# Patient Record
Sex: Male | Born: 1980 | Race: Black or African American | Hispanic: No | Marital: Single | Smoking: Never smoker
Health system: Southern US, Community
[De-identification: ages and names within clinical notes are randomized; demographics above are authoritative.]

## PROBLEM LIST (undated history)

## (undated) DIAGNOSIS — J9601 Acute respiratory failure with hypoxia: Secondary | ICD-10-CM

## (undated) DIAGNOSIS — I5031 Acute diastolic (congestive) heart failure: Secondary | ICD-10-CM

## (undated) DIAGNOSIS — I10 Essential (primary) hypertension: Secondary | ICD-10-CM

## (undated) DIAGNOSIS — I509 Heart failure, unspecified: Secondary | ICD-10-CM

## (undated) DIAGNOSIS — J111 Influenza due to unidentified influenza virus with other respiratory manifestations: Secondary | ICD-10-CM

## (undated) DIAGNOSIS — G4733 Obstructive sleep apnea (adult) (pediatric): Secondary | ICD-10-CM

## (undated) DIAGNOSIS — D751 Secondary polycythemia: Secondary | ICD-10-CM

## (undated) HISTORY — PX: OTHER SURGICAL HISTORY: SHX169

---
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
## 2018-09-19 ENCOUNTER — Encounter (HOSPITAL_COMMUNITY): Payer: Self-pay | Admitting: Internal Medicine

## 2018-09-19 ENCOUNTER — Inpatient Hospital Stay (HOSPITAL_COMMUNITY)
Admission: AD | Admit: 2018-09-19 | Discharge: 2018-09-27 | DRG: 291 | Disposition: A | Discharge status: Home / Self Care | Payer: PRIVATE HEALTH INSURANCE | Attending: Student | Admitting: Student

## 2018-09-19 ENCOUNTER — Other Ambulatory Visit: Payer: Self-pay

## 2018-09-19 DIAGNOSIS — Z7951 Long term (current) use of inhaled steroids: Secondary | ICD-10-CM | POA: Diagnosis not present

## 2018-09-19 DIAGNOSIS — Z8249 Family history of ischemic heart disease and other diseases of the circulatory system: Secondary | ICD-10-CM | POA: Diagnosis present

## 2018-09-19 DIAGNOSIS — Z5320 Procedure and treatment not carried out because of patient's decision for unspecified reasons: Secondary | ICD-10-CM | POA: Diagnosis not present

## 2018-09-19 DIAGNOSIS — E66813 Obesity, class 3: Secondary | ICD-10-CM

## 2018-09-19 DIAGNOSIS — Z7984 Long term (current) use of oral hypoglycemic drugs: Secondary | ICD-10-CM | POA: Diagnosis not present

## 2018-09-19 DIAGNOSIS — I2723 Pulmonary hypertension due to lung diseases and hypoxia: Secondary | ICD-10-CM | POA: Diagnosis present

## 2018-09-19 DIAGNOSIS — I4519 Other right bundle-branch block: Secondary | ICD-10-CM | POA: Diagnosis present

## 2018-09-19 DIAGNOSIS — I509 Heart failure, unspecified: Secondary | ICD-10-CM | POA: Insufficient documentation

## 2018-09-19 DIAGNOSIS — R7303 Prediabetes: Secondary | ICD-10-CM | POA: Diagnosis present

## 2018-09-19 DIAGNOSIS — G4733 Obstructive sleep apnea (adult) (pediatric): Secondary | ICD-10-CM | POA: Diagnosis present

## 2018-09-19 DIAGNOSIS — Z1159 Encounter for screening for other viral diseases: Secondary | ICD-10-CM | POA: Diagnosis not present

## 2018-09-19 DIAGNOSIS — J101 Influenza due to other identified influenza virus with other respiratory manifestations: Secondary | ICD-10-CM | POA: Diagnosis present

## 2018-09-19 DIAGNOSIS — J9621 Acute and chronic respiratory failure with hypoxia: Secondary | ICD-10-CM | POA: Diagnosis present

## 2018-09-19 DIAGNOSIS — E785 Hyperlipidemia, unspecified: Secondary | ICD-10-CM | POA: Diagnosis present

## 2018-09-19 DIAGNOSIS — E7800 Pure hypercholesterolemia, unspecified: Secondary | ICD-10-CM | POA: Diagnosis not present

## 2018-09-19 DIAGNOSIS — I2722 Pulmonary hypertension due to left heart disease: Secondary | ICD-10-CM | POA: Diagnosis present

## 2018-09-19 DIAGNOSIS — G4730 Sleep apnea, unspecified: Secondary | ICD-10-CM | POA: Diagnosis not present

## 2018-09-19 DIAGNOSIS — I5032 Chronic diastolic (congestive) heart failure: Secondary | ICD-10-CM | POA: Insufficient documentation

## 2018-09-19 DIAGNOSIS — Z6841 Body Mass Index (BMI) 40.0 and over, adult: Secondary | ICD-10-CM

## 2018-09-19 DIAGNOSIS — J9622 Acute and chronic respiratory failure with hypercapnia: Secondary | ICD-10-CM | POA: Diagnosis present

## 2018-09-19 DIAGNOSIS — J9601 Acute respiratory failure with hypoxia: Secondary | ICD-10-CM

## 2018-09-19 DIAGNOSIS — J1000 Influenza due to other identified influenza virus with unspecified type of pneumonia: Secondary | ICD-10-CM | POA: Diagnosis present

## 2018-09-19 DIAGNOSIS — I272 Pulmonary hypertension, unspecified: Secondary | ICD-10-CM | POA: Diagnosis not present

## 2018-09-19 DIAGNOSIS — J1100 Influenza due to unidentified influenza virus with unspecified type of pneumonia: Secondary | ICD-10-CM | POA: Diagnosis not present

## 2018-09-19 DIAGNOSIS — Z20828 Contact with and (suspected) exposure to other viral communicable diseases: Secondary | ICD-10-CM | POA: Diagnosis not present

## 2018-09-19 DIAGNOSIS — J111 Influenza due to unidentified influenza virus with other respiratory manifestations: Secondary | ICD-10-CM

## 2018-09-19 DIAGNOSIS — I10 Essential (primary) hypertension: Secondary | ICD-10-CM

## 2018-09-19 DIAGNOSIS — I5031 Acute diastolic (congestive) heart failure: Secondary | ICD-10-CM | POA: Diagnosis present

## 2018-09-19 DIAGNOSIS — Z79899 Other long term (current) drug therapy: Secondary | ICD-10-CM | POA: Diagnosis not present

## 2018-09-19 DIAGNOSIS — I110 Hypertensive heart disease with heart failure: Principal | ICD-10-CM | POA: Diagnosis present

## 2018-09-19 DIAGNOSIS — E662 Morbid (severe) obesity with alveolar hypoventilation: Secondary | ICD-10-CM | POA: Diagnosis present

## 2018-09-19 DIAGNOSIS — J398 Other specified diseases of upper respiratory tract: Secondary | ICD-10-CM | POA: Diagnosis present

## 2018-09-19 DIAGNOSIS — D751 Secondary polycythemia: Secondary | ICD-10-CM | POA: Diagnosis present

## 2018-09-19 DIAGNOSIS — D582 Other hemoglobinopathies: Secondary | ICD-10-CM | POA: Diagnosis not present

## 2018-09-19 DIAGNOSIS — I5041 Acute combined systolic (congestive) and diastolic (congestive) heart failure: Secondary | ICD-10-CM | POA: Diagnosis not present

## 2018-09-19 DIAGNOSIS — Z8042 Family history of malignant neoplasm of prostate: Secondary | ICD-10-CM | POA: Diagnosis not present

## 2018-09-19 HISTORY — DX: Acute respiratory failure with hypoxia: J96.01

## 2018-09-19 HISTORY — DX: Influenza due to unidentified influenza virus with other respiratory manifestations: J11.1

## 2018-09-19 HISTORY — DX: Essential (primary) hypertension: I10

## 2018-09-19 HISTORY — DX: Heart failure, unspecified: I50.9

## 2018-09-19 HISTORY — DX: Morbid (severe) obesity due to excess calories: E66.01

## 2018-09-19 HISTORY — DX: Obesity, class 3: E66.813

## 2018-09-19 LAB — COMPREHENSIVE METABOLIC PANEL
ALT: 34 U/L (ref 0–44)
AST: 26 U/L (ref 15–41)
Albumin: 3.4 g/dL — ABNORMAL LOW (ref 3.5–5.0)
Alkaline Phosphatase: 61 U/L (ref 38–126)
Anion gap: 13 (ref 5–15)
BUN: 12 mg/dL (ref 6–20)
CO2: 37 mmol/L — ABNORMAL HIGH (ref 22–32)
Calcium: 9.4 mg/dL (ref 8.9–10.3)
Chloride: 97 mmol/L — ABNORMAL LOW (ref 98–111)
Creatinine, Ser: 1.27 mg/dL — ABNORMAL HIGH (ref 0.61–1.24)
GFR calc Af Amer: >60 mL/min (ref >60)
GFR calc non Af Amer: >60 mL/min (ref >60)
Glucose, Bld: 102 mg/dL — ABNORMAL HIGH (ref 70–99)
Potassium: 4.3 mmol/L (ref 3.5–5.1)
Sodium: 147 mmol/L — ABNORMAL HIGH (ref 135–145)
Total Bilirubin: 0.9 mg/dL (ref 0.3–1.2)
Total Protein: 6 g/dL — ABNORMAL LOW (ref 6.5–8.1)

## 2018-09-19 LAB — CBC WITH DIFFERENTIAL/PLATELET
Abs Immature Granulocytes: 0.06 10*3/uL (ref 0.00–0.07)
Basophils Absolute: 0 10*3/uL (ref 0.0–0.1)
Basophils Relative: 1 %
Eosinophils Absolute: 0 10*3/uL (ref 0.0–0.5)
Eosinophils Relative: 0 %
HCT: 60.1 % — ABNORMAL HIGH (ref 39.0–52.0)
Hemoglobin: 17.4 g/dL — ABNORMAL HIGH (ref 13.0–17.0)
Immature Granulocytes: 1 %
Lymphocytes Relative: 13 %
Lymphs Abs: 1.1 10*3/uL (ref 0.7–4.0)
MCH: 24.3 pg — ABNORMAL LOW (ref 26.0–34.0)
MCHC: 29 g/dL — ABNORMAL LOW (ref 30.0–36.0)
MCV: 83.9 fL (ref 80.0–100.0)
Monocytes Absolute: 0.8 10*3/uL (ref 0.1–1.0)
Monocytes Relative: 9 %
Neutro Abs: 6.8 10*3/uL (ref 1.7–7.7)
Neutrophils Relative %: 76 %
Platelets: 203 10*3/uL (ref 150–400)
RBC: 7.16 MIL/uL — ABNORMAL HIGH (ref 4.22–5.81)
RDW: 20 % — ABNORMAL HIGH (ref 11.5–15.5)
WBC: 8.8 10*3/uL (ref 4.0–10.5)
nRBC: 0.3 % — ABNORMAL HIGH (ref 0.0–0.2)

## 2018-09-19 LAB — FERRITIN: Ferritin: 33 ng/mL (ref 24–336)

## 2018-09-19 MED ORDER — FUROSEMIDE 10 MG/ML IJ SOLN
80.0000 mg | Freq: Two times a day (BID) | INTRAMUSCULAR | Status: DC
  Administered 2018-09-19 – 2018-09-21 (×6): 80 mg via INTRAVENOUS
  Filled 2018-09-19 (×7): qty 8

## 2018-09-19 MED ORDER — HEPARIN SODIUM (PORCINE) 5000 UNIT/ML IJ SOLN
5000.0000 [IU] | Freq: Three times a day (TID) | INTRAMUSCULAR | Status: DC
  Administered 2018-09-19 – 2018-09-20 (×4): 5000 [IU] via SUBCUTANEOUS
  Filled 2018-09-19 (×4): qty 1

## 2018-09-19 MED ORDER — ONDANSETRON HCL 4 MG PO TABS: 4.0000 mg | ORAL_TABLET | Freq: Four times a day (QID) | ORAL | Status: DC | PRN

## 2018-09-19 MED ORDER — ONDANSETRON HCL 4 MG/2ML IJ SOLN: 4.0000 mg | Freq: Four times a day (QID) | INTRAMUSCULAR | Status: DC | PRN

## 2018-09-19 MED ORDER — SODIUM CHLORIDE 0.9% FLUSH: 10.0000 mL | INTRAVENOUS | Status: DC | PRN

## 2018-09-19 MED ORDER — SODIUM CHLORIDE 0.9% FLUSH
3.0000 mL | Freq: Two times a day (BID) | INTRAVENOUS | Status: DC
  Administered 2018-09-19 (×2): 3 mL via INTRAVENOUS

## 2018-09-19 MED ORDER — ACETAMINOPHEN 325 MG PO TABS
650.0000 mg | ORAL_TABLET | Freq: Four times a day (QID) | ORAL | Status: DC | PRN
  Administered 2018-09-24 – 2018-09-26 (×2): 650 mg via ORAL
  Filled 2018-09-19 (×2): qty 2

## 2018-09-19 MED ORDER — SODIUM CHLORIDE 0.9 % IV SOLN: 250.0000 mL | INTRAVENOUS | Status: DC | PRN

## 2018-09-19 MED ORDER — SODIUM CHLORIDE 0.9% FLUSH
3.0000 mL | Freq: Two times a day (BID) | INTRAVENOUS | Status: DC
  Administered 2018-09-19 – 2018-09-21 (×4): 3 mL via INTRAVENOUS

## 2018-09-19 MED ORDER — OSELTAMIVIR PHOSPHATE 75 MG PO CAPS
75.0000 mg | ORAL_CAPSULE | Freq: Two times a day (BID) | ORAL | Status: AC
  Administered 2018-09-19 – 2018-09-24 (×10): 75 mg via ORAL
  Filled 2018-09-19 (×11): qty 1

## 2018-09-19 MED ORDER — SODIUM CHLORIDE 0.9% FLUSH: 3.0000 mL | INTRAVENOUS | Status: DC | PRN

## 2018-09-19 NOTE — Progress Notes (Signed)
Daily Nursing Note  Family Medicine - Dr. Maylene Roes  Received report from Nadean Corwin, RN at [facility]. There was a bit of confusion concerning the patients admission as I have not received a formal report prior to patient arriving. Introduced self to patient who at the time stated he was "swollen all over". We discussed the fact that he had Influenza A/B and whether or not he had been exposed to Covid patients. 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 Patient given lasix 85m IVP on arrival. A midline catheter was placed in his R Upper arm given the complexity of blood draws for this patient as he is obese as well as edematous. Patient is not aware of a prior h/o of CHF though thinks from what he read on WebMD that this is a likely possibility given his symptoms. Workup remains in process. All patients need met during shift.   Micro: Novel Coronavirus - Pending though [facility]  Updates: Patient able to advocate for himself.   Dispo: Will be discharged to home when medically optimized.

## 2018-09-19 NOTE — H&P (Addendum)
History and Physical    Frank Watts CWU:[id number] DOB: December 15, 1980 DOA: 09/19/2018  PCP: No primary care provider on file.  Patient coming from: Home --[facility] --> [facility]   Chief Complaint: Exertional dyspnea   HPI: Frank Watts is a 38 y.o. male with medical history significant of recently diagnosed HTN who has not seen his primary care physician for many years who presented to [facility] ED with several week history of shortness of breath.  He had seen a city Therapist, sports (patient works for [employer]) who had prescribed steroids, antibiotics and albuterol.  Despite these treatments, patient has not seen any improvement.  He denies any fevers or sick contacts.  He does admit to lower extremity peripheral edema that has been ongoing for several weeks, as well as upper extremity edema as well.  He denies any fevers, chills or body aches.  No chest pain.  He had some nausea without vomiting.  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  His cough has been mix of clear productive sputum and nonproductive cough.  ED Course: Patient required up to 6 L nasal cannula O2 to maintain saturation at 100% in the emergency department.   [facility] ED work up: WBC 10.4 Hgb 17.9 Na 141 K 4.9 Cr 1.3 Lactic 3.1 --> 1.8 AST 33  ALT 42 LDH 871 Trop 0.03 CRP 33.4 Pro BNP 5690 Procalcitonin 0.21 Covid 19- pending  Influenza A and B positive. Given dose of tamiflu  CXR limited exam due to body habitus, cardiomegaly,questionable left suprahilar opacity  EKG NSR, incomplete RBBB   Review of Systems: As per HPI otherwise 10 point review of systems negative.   Past Medical History:  Diagnosis Date  . HTN (hypertension)     Past Surgical History:  Procedure Laterality Date  . OTHER SURGICAL HISTORY     Bicep surgery     reports that he has never smoked. He does not have any smokeless tobacco history on file. No history on file for alcohol and drug.  No  Known Allergies  Family History  Problem Relation Age of Onset  . Hypertension Mother   . Prostate cancer Father     Prior to Admission medications   Not on File    Physical Exam: Vitals:   09/19/18 1013  BP: (!) 143/97  Pulse: 85  Resp: (!) 25  Temp: 98.7 F (37.1 C)  TempSrc: Oral  SpO2: 91%  Weight: (!) 170.1 kg  Height: _0  (1.778 m)    Constitutional: NAD, calm, comfortable Eyes: PERRL, lids and conjunctivae normal ENMT: Mucous membranes are moist. Posterior pharynx clear of any exudate or lesions.Normal dentition.  Neck: normal, supple, no masses, no thyromegaly Respiratory: Diminished breath sounds bilaterally, no respiratory distress, currently on 4 L nasal cannula O2 Cardiovascular: Regular rate and rhythm, + pitting peripheral edema Abdomen: no tenderness, no masses palpated. No hepatosplenomegaly. Bowel sounds positive.  Musculoskeletal: no clubbing / cyanosis. No joint deformity upper and lower extremities. Normal muscle tone.  Skin: no rashes, lesions, ulcers on exposed skin Neurologic: CN 2-12 grossly intact.  Speech clear Psychiatric: Normal judgment and insight. Alert and oriented x 3. Normal mood.   Labs on Admission: I have personally reviewed following labs and imaging studies  CBC: No results for input(s): WBC, NEUTROABS, HGB, HCT, MCV, PLT in the last 168 hours. Basic Metabolic Panel: No results for input(s): NA, K, CL, CO2, GLUCOSE, BUN, CREATININE, CALCIUM, MG, PHOS in the last 168 hours. GFR:  CrCl cannot be calculated (No successful lab value found.). Liver Function Tests: No results for input(s): AST, ALT, ALKPHOS, BILITOT, PROT, ALBUMIN in the last 168 hours. No results for input(s): LIPASE, AMYLASE in the last 168 hours. No results for input(s): AMMONIA in the last 168 hours. Coagulation Profile: No results for input(s): INR, PROTIME in the last 168 hours. Cardiac Enzymes: No results for input(s): CKTOTAL, CKMB, CKMBINDEX, TROPONINI  in the last 168 hours. BNP (last 3 results) No results for input(s): PROBNP in the last 8760 hours. HbA1C: No results for input(s): HGBA1C in the last 72 hours. CBG: No results for input(s): GLUCAP in the last 168 hours. Lipid Profile: No results for input(s): CHOL, HDL, LDLCALC, TRIG, CHOLHDL, LDLDIRECT in the last 72 hours. Thyroid Function Tests: No results for input(s): TSH, T4TOTAL, FREET4, T3FREE, THYROIDAB in the last 72 hours. Anemia Panel: No results for input(s): VITAMINB12, FOLATE, FERRITIN, TIBC, IRON, RETICCTPCT in the last 72 hours. Urine analysis: No results found for: COLORURINE, APPEARANCEUR, LABSPEC, PHURINE, GLUCOSEU, HGBUR, BILIRUBINUR, KETONESUR, PROTEINUR, UROBILINOGEN, NITRITE, LEUKOCYTESUR Sepsis Labs: !!!!!!!!!!!!!!!!!!!!!!!!!!!!!!!!!!!!!!!!!!!! _0 (procalcitonin:4,lacticidven:4) )No results found for this or any previous visit (from the past 240 hour(s)).   Radiological Exams on Admission: No results found.   Assessment/Plan Principal Problem:   Acute hypoxemic respiratory failure (HCC) Active Problems:   Acute CHF (congestive heart failure) (HCC)   Influenza   Essential hypertension   Morbid obesity  Acute hypoxemic respiratory failure -Required up to 6 L nasal cannula O2 in the emergency department, currently weaned down to 4 L.  Continue to wean as able  New onset congestive heart failure -Unknown if systolic or diastolic -Echocardiogram when COVID testing has been ruled out -Continue IV Lasix -Strict I's and O's -Daily weight  Influenza A and B -Tamiflu x5 days  Essential hypertension -Patient was recently started on antihypertensive, patient does not recall name of this medication -May consider coreg or cozaar initiation once we have echocardiogram results and trending Cr   Morbid obesity Body mass index is 53.81 kg/m.     DVT prophylaxis: Subcutaneous heparin Code Status: Full code, confirmed with patient at time of  admission Family Communication: None Disposition Plan: Pending diuresis and COVID testing Consults called: None Admission status: Inpatient  Severity of Illness: The appropriate patient status for this patient is INPATIENT. Inpatient status is judged to be reasonable and necessary in order to provide the required intensity of service to ensure the patient's safety. The patient's presenting symptoms, physical exam findings, and initial radiographic and laboratory data in the context of their chronic comorbidities is felt to place them at high risk for further clinical deterioration. Furthermore, it is not anticipated that the patient will be medically stable for discharge from the hospital within 2 midnights of admission. The following factors support the patient status of inpatient.   " The patient's presenting symptoms include exertional shortness of breath. " The worrisome physical exam findings include bilateral peripheral edema. " The initial radiographic and laboratory data are worrisome because of elevated proBNP. " The chronic co-morbidities include morbid obesity.   * I certify that at the point of admission it is my clinical judgment that the patient will require inpatient hospital care spanning beyond 2 midnights from the point of admission due to high intensity of service, high risk for further deterioration and high frequency of surveillance required.Dessa Phi, DO [facility] 09/19/2018, 10:53 AM    How to contact the [facility] Attending or Consulting provider [place]  or covering provider during after hours [place], for this patient?  1. Check the care team in [facility] and look for a) attending/consulting [facility] provider listed and b) the [facility] team listed 2. Log into [email or web address] and use [place]'s universal password to access. If you do not have the password, please contact the hospital operator. 3. Locate the [facility] provider you are looking for under [facility] and page  to a number that you can be directly reached. 4. If you still have difficulty reaching the provider, please page the [facility] (Director on Call) for the Hospitalists listed on amion for assistance.

## 2018-09-20 ENCOUNTER — Encounter (HOSPITAL_COMMUNITY): Payer: Self-pay

## 2018-09-20 ENCOUNTER — Inpatient Hospital Stay (HOSPITAL_COMMUNITY): Payer: PRIVATE HEALTH INSURANCE

## 2018-09-20 DIAGNOSIS — J111 Influenza due to unidentified influenza virus with other respiratory manifestations: Secondary | ICD-10-CM

## 2018-09-20 DIAGNOSIS — I5041 Acute combined systolic (congestive) and diastolic (congestive) heart failure: Secondary | ICD-10-CM

## 2018-09-20 DIAGNOSIS — I10 Essential (primary) hypertension: Secondary | ICD-10-CM

## 2018-09-20 LAB — COMPREHENSIVE METABOLIC PANEL
ALT: 30 U/L (ref 0–44)
AST: 28 U/L (ref 15–41)
Albumin: 3.1 g/dL — ABNORMAL LOW (ref 3.5–5.0)
Alkaline Phosphatase: 56 U/L (ref 38–126)
Anion gap: 14 (ref 5–15)
BUN: 13 mg/dL (ref 6–20)
CO2: 37 mmol/L — ABNORMAL HIGH (ref 22–32)
Calcium: 9.2 mg/dL (ref 8.9–10.3)
Chloride: 94 mmol/L — ABNORMAL LOW (ref 98–111)
Creatinine, Ser: 1.13 mg/dL (ref 0.61–1.24)
GFR calc Af Amer: >60 mL/min (ref >60)
GFR calc non Af Amer: >60 mL/min (ref >60)
Glucose, Bld: 99 mg/dL (ref 70–99)
Potassium: 4.2 mmol/L (ref 3.5–5.1)
Sodium: 145 mmol/L (ref 135–145)
Total Bilirubin: 1.2 mg/dL (ref 0.3–1.2)
Total Protein: 5.7 g/dL — ABNORMAL LOW (ref 6.5–8.1)

## 2018-09-20 LAB — CBC WITH DIFFERENTIAL/PLATELET
Abs Immature Granulocytes: 0.03 10*3/uL (ref 0.00–0.07)
Basophils Absolute: 0 10*3/uL (ref 0.0–0.1)
Basophils Relative: 0 %
Eosinophils Absolute: 0.1 10*3/uL (ref 0.0–0.5)
Eosinophils Relative: 1 %
HCT: 57.8 % — ABNORMAL HIGH (ref 39.0–52.0)
Hemoglobin: 16.7 g/dL (ref 13.0–17.0)
Immature Granulocytes: 0 %
Lymphocytes Relative: 13 %
Lymphs Abs: 1 10*3/uL (ref 0.7–4.0)
MCH: 25 pg — ABNORMAL LOW (ref 26.0–34.0)
MCHC: 28.9 g/dL — ABNORMAL LOW (ref 30.0–36.0)
MCV: 86.4 fL (ref 80.0–100.0)
Monocytes Absolute: 1.1 10*3/uL — ABNORMAL HIGH (ref 0.1–1.0)
Monocytes Relative: 14 %
Neutro Abs: 5.7 10*3/uL (ref 1.7–7.7)
Neutrophils Relative %: 72 %
Platelets: 188 10*3/uL (ref 150–400)
RBC: 6.69 MIL/uL — ABNORMAL HIGH (ref 4.22–5.81)
RDW: 19.8 % — ABNORMAL HIGH (ref 11.5–15.5)
WBC: 7.9 10*3/uL (ref 4.0–10.5)
nRBC: 0 % (ref 0.0–0.2)

## 2018-09-20 LAB — CK: Total CK: 136 U/L (ref 49–397)

## 2018-09-20 LAB — TROPONIN I: Troponin I: 0.03 ng/mL (ref <0.03)

## 2018-09-20 LAB — D-DIMER, QUANTITATIVE (NOT AT ARMC): D-Dimer, Quant: 2.13 ug/mL-FEU — ABNORMAL HIGH (ref 0.00–0.50)

## 2018-09-20 LAB — SEDIMENTATION RATE: Sed Rate: 0 mm/hr (ref 0–16)

## 2018-09-20 LAB — LACTATE DEHYDROGENASE: LDH: 272 U/L — ABNORMAL HIGH (ref 98–192)

## 2018-09-20 LAB — C-REACTIVE PROTEIN: CRP: 3 mg/dL — ABNORMAL HIGH (ref <1.0)

## 2018-09-20 LAB — PROCALCITONIN: Procalcitonin: <0.1 ng/mL

## 2018-09-20 MED ORDER — HYDROXYCHLOROQUINE SULFATE 200 MG PO TABS: 200.0000 mg | ORAL_TABLET | Freq: Two times a day (BID) | ORAL | Status: DC

## 2018-09-20 MED ORDER — ASPIRIN EC 81 MG PO TBEC
81.0000 mg | DELAYED_RELEASE_TABLET | Freq: Every day | ORAL | Status: DC
  Administered 2018-09-20 – 2018-09-27 (×8): 81 mg via ORAL
  Filled 2018-09-20 (×8): qty 1

## 2018-09-20 MED ORDER — AZITHROMYCIN 250 MG PO TABS
500.0000 mg | ORAL_TABLET | Freq: Every day | ORAL | Status: AC
  Administered 2018-09-20: 500 mg via ORAL
  Filled 2018-09-20: qty 2

## 2018-09-20 MED ORDER — ALBUTEROL SULFATE HFA 108 (90 BASE) MCG/ACT IN AERS
2.0000 | INHALATION_SPRAY | RESPIRATORY_TRACT | Status: DC | PRN
  Filled 2018-09-20: qty 6.7

## 2018-09-20 MED ORDER — FUROSEMIDE 10 MG/ML IJ SOLN
40.0000 mg | Freq: Once | INTRAMUSCULAR | Status: AC
  Administered 2018-09-20: 40 mg via INTRAVENOUS
  Filled 2018-09-20: qty 4

## 2018-09-20 MED ORDER — LORATADINE 10 MG PO TABS
10.0000 mg | ORAL_TABLET | Freq: Every day | ORAL | Status: DC
  Administered 2018-09-20 – 2018-09-27 (×8): 10 mg via ORAL
  Filled 2018-09-20 (×8): qty 1

## 2018-09-20 MED ORDER — ENOXAPARIN SODIUM 40 MG/0.4ML ~~LOC~~ SOLN
40.0000 mg | SUBCUTANEOUS | Status: DC
  Administered 2018-09-20 – 2018-09-21 (×2): 40 mg via SUBCUTANEOUS
  Filled 2018-09-20 (×2): qty 0.4

## 2018-09-20 MED ORDER — AZITHROMYCIN 250 MG PO TABS
250.0000 mg | ORAL_TABLET | Freq: Every day | ORAL | Status: DC
  Administered 2018-09-21: 250 mg via ORAL
  Filled 2018-09-20: qty 1

## 2018-09-20 MED ORDER — MONTELUKAST SODIUM 10 MG PO TABS
10.0000 mg | ORAL_TABLET | Freq: Every day | ORAL | Status: DC
  Administered 2018-09-20 – 2018-09-26 (×7): 10 mg via ORAL
  Filled 2018-09-20 (×7): qty 1

## 2018-09-20 MED ORDER — IOHEXOL 350 MG/ML SOLN
75.0000 mL | Freq: Once | INTRAVENOUS | Status: AC | PRN
  Administered 2018-09-20: 75 mL via INTRAVENOUS

## 2018-09-20 MED ORDER — HYDROXYCHLOROQUINE SULFATE 200 MG PO TABS
400.0000 mg | ORAL_TABLET | Freq: Two times a day (BID) | ORAL | Status: AC
  Administered 2018-09-20 – 2018-09-21 (×2): 400 mg via ORAL
  Filled 2018-09-20 (×2): qty 2

## 2018-09-20 NOTE — Progress Notes (Addendum)
[facility]                                                                              Patient Demographics  Frank Watts, is a 38 y.o. male, DOB - 03-05-1981, GEZ:[id number]  Admit date - 09/19/2018   Admitting Physician Rise Patience, MD  Outpatient Primary MD for the patient is No primary care provider on file.  Outpatient specialists:   LOS - 1  days   Medical records reviewed and are as summarized below:    No chief complaint on file.      Brief summary   Patient is a 38 year old male with a history of recently diagnosed hypertension, has not seen primary care physician in many years presented to [facility] ED with several week history of shortness of breath.  Patient had been prescribed steroids, antibiotics and albuterol outpatient by [facility], patient works for [employer]  Patient denies any fevers or sick contacts, reports lower extremity peripheral edema and shortness of breath In ED, patient required up to 6 L nasal cannula to maintain sats 100% [facility] ED work-up showed sodium 141, WBCs 10.4, creatinine 1.3, AST 33, ALT 42, LDH 871, troponin 0.03, CRP 33.4, pro BNP 5690, procalcitonin 0.21 Influenza A and B both+.  D-dimer 2.13 COVID-19 pending Chest x-ray showed cardiomegaly with questionable left suprahilar opacity  Assessment & Plan    Principal Problem:   Acute hypoxemic respiratory failure (HCC) with influenza positive, left sided pneumonia --COVID-19 pending -Continue Tamiflu 75 mg twice daily for 5 days, patient also placed on diuresis -CT angiogram chest showed no PE but patchy groundglass opacity in both lungs most prominent in the right middle lobe, consider pulmonary edema given cardiomegaly with differential including atypical infection such as viral pneumonia, pulmonary arterial hypertension. -Ferritin 33, will obtain ESR, CRP, LDH, triglycerides -Patient placed on diuresis, however still oxygen requirement trending  up, Lasix 40 mg IV x1 extra - obtain EKG for QTC, troponin x1, ESR, CRP, CK, LDH, procalcitonin, ferritin 33 -Patient placed on 9 L high flow nasal cannula, oxygen requirements have trended up from overnight, patient not symptomatic and talking in full sentences.  Placed on Zithromax.  Follow QTC.  Will place on Plaquenil if QTC stable. -Follow venous Dopplers lower extremities  Addendum: 2:20pm EKG Rate 88, QTc 440 with STEMI pattern, left-sided injury Called cardiology, discussed with Dr. Margaretann Loveless, recommended stat EKG again to repeat to rule out evolving STEMI.  Troponin pending  Addendum 3:30pm Repeat EKG shows no ST elevations, no further need of any work-up unless any cardiac status change.  Discussed with cardiology, Dr Margaretann Loveless.   Addendum 4:25 PM Discussed with CCM, Dr. Chase Caller regarding patient's persistent hypoxia, asymptomatic.  Recommended to obtain ferritin, CRP, d-dimer, interleukin-6, LDH, troponin tomorrow a.m.  repeated respiratory virus panel today -may be a candidate for tocilizumab, will monitor closely  Active Problems:   Acute CHF (congestive heart failure) (HCC) -Continue Lasix 80 mg every 12 hours, 40 mg IV x1 extra, creatinine stable -If ruled out for covid, will obtain 2D echocardiogram, obtain troponins x1 Obtain strict I's and O's    Influenza a and B  Continue Tamiflu 75 mg twice a day    Essential hypertension Continue Lasix    Morbid obesity Counseled strongly on diet and weight control  Code Status: Full CODE STATUS DVT Prophylaxis: Heparin Family Communication: Discussed in detail with the patient, all imaging results, lab results explained to the patient    Disposition Plan: Not medically ready, on 9 L high flow nasal cannula  Time Spent in minutes: 35 minutes  Procedures:  CT angiogram of the chest  Consultants:   None  Antimicrobials:   Anti-infectives (From admission, onward)   Start     Dose/Rate Route Frequency Ordered Stop    09/19/18 2200  oseltamivir (TAMIFLU) capsule 75 mg     75 mg Oral 2 times daily 09/19/18 1052 09/24/18 2159          Medications  Scheduled Meds: . furosemide  80 mg Intravenous Q12H  . heparin  5,000 Units Subcutaneous Q8H  . oseltamivir  75 mg Oral BID  . sodium chloride flush  3 mL Intravenous Q12H  . sodium chloride flush  3 mL Intravenous Q12H   Continuous Infusions: . sodium chloride     PRN Meds:.sodium chloride, acetaminophen, ondansetron **OR** ondansetron (ZOFRAN) IV, sodium chloride flush, sodium chloride flush      Subjective:   [name] was seen and examined today.  + Shortness of breath with swelling in the legs.  Patient denies dizziness,  abdominal pain, N/V/D/C, new weakness, numbess, tingling. No acute events overnight.    Objective:   Vitals:   09/20/18 0003 09/20/18 0416 09/20/18 0743 09/20/18 1200  BP: (!) 152/97  131/73   Pulse: 86 91 81   Resp: 20 (!) 23 16   Temp: 98.8 F (37.1 C) 98.2 F (36.8 C)  97.7 F (36.5 C)  TempSrc: Oral Oral  Oral  SpO2: 97% 91% 96% 93%  Weight:      Height:        Intake/Output Summary (Last 24 hours) at 09/20/2018 1323 Last data filed at 09/20/2018 1200 Gross per 24 hour  Intake 960 ml  Output 5725 ml  Net -4765 ml     Wt Readings from Last 3 Encounters:  09/19/18 (!) 170.1 kg     Exam  General: Alert and oriented x 3, NAD  Eyes:  HEENT:  Atraumatic, normocephalic, normal oropharynx  Cardiovascular: S1 S2 auscultated,. Regular rate and rhythm.  Respiratory: Decreased breath sound at the bases  Gastrointestinal: Morbidly obese soft, nontender, nondistended, + bowel sounds  Ext: 2+ pedal edema bilaterally  Neuro: No new deficits  Musculoskeletal: No digital cyanosis, clubbing  Skin: No rashes  Psych: Normal affect and demeanor, alert and oriented x3    Data Reviewed:  I have personally reviewed following labs and imaging studies  Micro Results No results found for this or  any previous visit (from the past 240 hour(s)).  Radiology Reports Ct Angio Chest Pe W Or Wo Contrast  Result Date: 09/20/2018 CLINICAL DATA:  Inpatient.  Dyspnea. EXAM: CT ANGIOGRAPHY CHEST WITH CONTRAST TECHNIQUE: Multidetector CT imaging of the chest was performed using the standard protocol during bolus administration of intravenous contrast. Multiplanar CT image reconstructions and MIPs were obtained to evaluate the vascular anatomy. CONTRAST:  23m OMNIPAQUE IOHEXOL 350 MG/ML SOLN COMPARISON:  Chest radiograph from one day prior. FINDINGS: Cardiovascular: The study is low to moderate quality for the evaluation of pulmonary embolism, limited by motion degradation and patient body habitus. There are no convincing filling defects in the central, lobar,  segmental or subsegmental pulmonary artery branches to suggest acute pulmonary embolism. Normal course and caliber of the thoracic aorta. Dilated main pulmonary artery (4.3 cm diameter). Mild cardiomegaly. No significant pericardial fluid/thickening. Mediastinum/Nodes: No discrete thyroid nodules. Unremarkable esophagus. No pathologically enlarged axillary, mediastinal or hilar lymph nodes. Lungs/Pleura: No pneumothorax. No pleural effusion. There is patchy ground-glass opacity in both lungs, most prominent in right middle lobe. Scattered small parenchymal bands in left upper and right lower lobes compatible with mild scarring or atelectasis. No lung masses or significant pulmonary nodules. No consolidative airspace disease. Upper abdomen: No acute abnormality. Musculoskeletal:  No aggressive appearing focal osseous lesions. Review of the MIP images confirms the above findings. IMPRESSION: 1. Limited scan.  No evidence of pulmonary embolism. 2. Dilated main pulmonary artery, suggesting pulmonary arterial hypertension. 3. Patchy ground-glass opacity in both lungs, most prominent in the right middle lobe. Consider pulmonary edema given the cardiomegaly, with the  differential including atypical infection such as viral pneumonia. Electronically Signed   By: Ilona Sorrel M.D.   On: 09/20/2018 11:53    Lab Data:  CBC: Recent Labs  Lab 09/19/18 1353 09/20/18 1018  WBC 8.8 7.9  NEUTROABS 6.8 5.7  HGB 17.4* 16.7  HCT 60.1* 57.8*  MCV 83.9 86.4  PLT 203 003   Basic Metabolic Panel: Recent Labs  Lab 09/19/18 1353 09/20/18 0621  NA 147* 145  K 4.3 4.2  CL 97* 94*  CO2 37* 37*  GLUCOSE 102* 99  BUN 12 13  CREATININE 1.27* 1.13  CALCIUM 9.4 9.2   GFR: Estimated Creatinine Clearance: 138.8 mL/min (by C-G formula based on SCr of 1.13 mg/dL). Liver Function Tests: Recent Labs  Lab 09/19/18 1353 09/20/18 0621  AST 26 28  ALT 34 30  ALKPHOS 61 56  BILITOT 0.9 1.2  PROT 6.0* 5.7*  ALBUMIN 3.4* 3.1*   No results for input(s): LIPASE, AMYLASE in the last 168 hours. No results for input(s): AMMONIA in the last 168 hours. Coagulation Profile: No results for input(s): INR, PROTIME in the last 168 hours. Cardiac Enzymes: No results for input(s): CKTOTAL, CKMB, CKMBINDEX, TROPONINI in the last 168 hours. BNP (last 3 results) No results for input(s): PROBNP in the last 8760 hours. HbA1C: No results for input(s): HGBA1C in the last 72 hours. CBG: No results for input(s): GLUCAP in the last 168 hours. Lipid Profile: No results for input(s): CHOL, HDL, LDLCALC, TRIG, CHOLHDL, LDLDIRECT in the last 72 hours. Thyroid Function Tests: No results for input(s): TSH, T4TOTAL, FREET4, T3FREE, THYROIDAB in the last 72 hours. Anemia Panel: Recent Labs    09/19/18 1353  FERRITIN 33   Urine analysis: No results found for: COLORURINE, APPEARANCEUR, LABSPEC, PHURINE, GLUCOSEU, HGBUR, BILIRUBINUR, KETONESUR, PROTEINUR, UROBILINOGEN, NITRITE, LEUKOCYTESUR   Thia Olesen M.D. [facility] 09/20/2018, 1:23 PM  Pager: [phone] Between 7am to 7pm - call Pager - [phone]  After 7pm go to [email or web address] - password [email or web address]  Call night  coverage person covering after 7pm

## 2018-09-20 NOTE — Progress Notes (Signed)
EKG CRITICAL VALUE     12 lead EKG performed.  Critical value noted.  Lilia Pro, RN notified.   Asencion Gowda, CCT 09/20/2018 2:23 PM

## 2018-09-20 NOTE — Progress Notes (Signed)
Call from  [facility]  Patient is rule out covid  Worsening hypoxemia  - 9L [place] but diong well and looks very comfortable Has Fu A and Flu B positiv ein [place] No lymphopenia  biomarkers last 24h CRP 3.0 Trop 0.03 Ferritin 33  Plan over phone rec rvp here repeat Await covid - low odds for having triple co-infection Prone positioningn Repeat biomarkers tomorrow - trop, ferriting, crp, d-dimer, IL-6  - if worsening + confirmed covid consider actemra   Ccm on standby    SIGNATURE    Dr. Brand Males, M.D., F.C.C.P,  Pulmonary and Critical Care Medicine Staff Physician, [place] Director - Interstitial Lung Disease  Program  Pulmonary [place] at [place], [place], [address]  Pager: [phone], If no answer or between  15:00h - 7:00h: call [phone] Telephone: [phone]  4:28 PM 09/20/2018

## 2018-09-21 ENCOUNTER — Inpatient Hospital Stay (HOSPITAL_COMMUNITY): Payer: PRIVATE HEALTH INSURANCE

## 2018-09-21 ENCOUNTER — Encounter (HOSPITAL_COMMUNITY): Payer: Self-pay

## 2018-09-21 DIAGNOSIS — J1100 Influenza due to unidentified influenza virus with unspecified type of pneumonia: Secondary | ICD-10-CM

## 2018-09-21 DIAGNOSIS — J9622 Acute and chronic respiratory failure with hypercapnia: Secondary | ICD-10-CM

## 2018-09-21 DIAGNOSIS — G4730 Sleep apnea, unspecified: Secondary | ICD-10-CM

## 2018-09-21 DIAGNOSIS — I509 Heart failure, unspecified: Secondary | ICD-10-CM

## 2018-09-21 DIAGNOSIS — I272 Pulmonary hypertension, unspecified: Secondary | ICD-10-CM

## 2018-09-21 DIAGNOSIS — J9621 Acute and chronic respiratory failure with hypoxia: Secondary | ICD-10-CM

## 2018-09-21 DIAGNOSIS — J9601 Acute respiratory failure with hypoxia: Secondary | ICD-10-CM

## 2018-09-21 LAB — BLOOD GAS, ARTERIAL
Acid-Base Excess: 17.5 mmol/L — ABNORMAL HIGH (ref 0.0–2.0)
Bicarbonate: 44.9 mmol/L — ABNORMAL HIGH (ref 20.0–28.0)
Drawn by: 213381
O2 Content: 15 L/min
O2 Saturation: 97.3 %
Patient temperature: 98.6
pCO2 arterial: 100 mmHg (ref 32.0–48.0)
pH, Arterial: 7.274 — ABNORMAL LOW (ref 7.350–7.450)
pO2, Arterial: 120 mmHg — ABNORMAL HIGH (ref 83.0–108.0)

## 2018-09-21 LAB — COMPREHENSIVE METABOLIC PANEL
ALT: 31 U/L (ref 0–44)
AST: 25 U/L (ref 15–41)
Albumin: 3.6 g/dL (ref 3.5–5.0)
Alkaline Phosphatase: 61 U/L (ref 38–126)
Anion gap: 17 — ABNORMAL HIGH (ref 5–15)
BUN: 13 mg/dL (ref 6–20)
CO2: 36 mmol/L — ABNORMAL HIGH (ref 22–32)
Calcium: 9.6 mg/dL (ref 8.9–10.3)
Chloride: 89 mmol/L — ABNORMAL LOW (ref 98–111)
Creatinine, Ser: 1.12 mg/dL (ref 0.61–1.24)
GFR calc Af Amer: >60 mL/min (ref >60)
GFR calc non Af Amer: >60 mL/min (ref >60)
Glucose, Bld: 88 mg/dL (ref 70–99)
Potassium: 3.9 mmol/L (ref 3.5–5.1)
Sodium: 142 mmol/L (ref 135–145)
Total Bilirubin: 1.1 mg/dL (ref 0.3–1.2)
Total Protein: 6.4 g/dL — ABNORMAL LOW (ref 6.5–8.1)

## 2018-09-21 LAB — CBC WITH DIFFERENTIAL/PLATELET
Abs Immature Granulocytes: 0.03 10*3/uL (ref 0.00–0.07)
Basophils Absolute: 0 10*3/uL (ref 0.0–0.1)
Basophils Relative: 0 %
Eosinophils Absolute: 0.1 10*3/uL (ref 0.0–0.5)
Eosinophils Relative: 1 %
HCT: 61 % — ABNORMAL HIGH (ref 39.0–52.0)
Hemoglobin: 17.7 g/dL — ABNORMAL HIGH (ref 13.0–17.0)
Immature Granulocytes: 0 %
Lymphocytes Relative: 12 %
Lymphs Abs: 1 10*3/uL (ref 0.7–4.0)
MCH: 24.2 pg — ABNORMAL LOW (ref 26.0–34.0)
MCHC: 29 g/dL — ABNORMAL LOW (ref 30.0–36.0)
MCV: 83.6 fL (ref 80.0–100.0)
Monocytes Absolute: 1.1 10*3/uL — ABNORMAL HIGH (ref 0.1–1.0)
Monocytes Relative: 13 %
Neutro Abs: 6.3 10*3/uL (ref 1.7–7.7)
Neutrophils Relative %: 74 %
Platelets: 187 10*3/uL (ref 150–400)
RBC: 7.3 MIL/uL — ABNORMAL HIGH (ref 4.22–5.81)
RDW: 19.6 % — ABNORMAL HIGH (ref 11.5–15.5)
WBC: 8.5 10*3/uL (ref 4.0–10.5)
nRBC: 0 % (ref 0.0–0.2)

## 2018-09-21 LAB — LACTATE DEHYDROGENASE: LDH: 328 U/L — ABNORMAL HIGH (ref 98–192)

## 2018-09-21 LAB — ECHOCARDIOGRAM COMPLETE
Height: 70 in
Weight: 8119.98 oz

## 2018-09-21 LAB — TROPONIN I
Troponin I: <0.03 ng/mL (ref <0.03)
Troponin I: 0.03 ng/mL (ref <0.03)

## 2018-09-21 LAB — D-DIMER, QUANTITATIVE: D-Dimer, Quant: 2 ug/mL-FEU — ABNORMAL HIGH (ref 0.00–0.50)

## 2018-09-21 LAB — FERRITIN: Ferritin: 41 ng/mL (ref 24–336)

## 2018-09-21 LAB — C-REACTIVE PROTEIN: CRP: 3.4 mg/dL — ABNORMAL HIGH (ref <1.0)

## 2018-09-21 MED ORDER — PERFLUTREN LIPID MICROSPHERE
1.0000 mL | INTRAVENOUS | Status: AC | PRN
  Administered 2018-09-21: 8 mL via INTRAVENOUS
  Filled 2018-09-21: qty 10

## 2018-09-21 MED ORDER — TOCILIZUMAB 400 MG/20ML IV SOLN
800.0000 mg | Freq: Once | INTRAVENOUS | Status: DC
  Filled 2018-09-21: qty 40

## 2018-09-21 NOTE — Progress Notes (Signed)
[facility]                                                                              Patient Demographics  Frank Watts, is a 38 y.o. male, DOB - April 22, 1981, JWJ:[id number]  Admit date - 09/19/2018   Admitting Physician Rise Patience, MD  Outpatient Primary MD for the patient is No primary care provider on file.  Outpatient specialists:   LOS - 2  days   Medical records reviewed and are as summarized below:    No chief complaint on file.      Brief summary   Patient is a 38 year old male with a history of recently diagnosed hypertension, has not seen primary care physician in many years presented to [facility] ED with several week history of shortness of breath.  Patient had been prescribed steroids, antibiotics and albuterol outpatient by [facility], patient works for [employer]  Patient denies any fevers or sick contacts, reports lower extremity peripheral edema and shortness of breath In ED, patient required up to 6 L nasal cannula to maintain sats 100% [facility] ED work-up showed sodium 141, WBCs 10.4, creatinine 1.3, AST 33, ALT 42, LDH 871, troponin 0.03, CRP 33.4, pro BNP 5690, procalcitonin 0.21 Influenza A and B both+.  D-dimer 2.13 COVID-19 NEGATIVE (confirmed with Oval Linsey today by charge nurse) Chest x-ray showed cardiomegaly with questionable left suprahilar opacity  Assessment & Plan    Principal Problem:   Acute hypoxemic and hypercarbic respiratory failure (HCC) with influenza positive, left sided pneumonia, OSA/OHS --COVID-19 negative -Continue Tamiflu 75 mg twice daily for 5 days, patient also placed on diuresis -CT angiogram chest showed no PE but patchy groundglass opacity in both lungs most prominent in the right middle lobe, consider pulmonary edema given cardiomegaly with differential including atypical infection such as viral pneumonia, pulmonary arterial hypertension. -Patient was placed on Zithromax, Plaquenil -Ferritin  ferritin 41, LDH 328, procalcitonin less than 0.1, CRP 3.4 -Continue diuresis.  At the time of my examination, patient on nonrebreather 15 L O2, CCM consulted and evaluated the patient.   -stat ABG showed CO2 retention, pH 7.2, PCO2 100, PO2 120 - COVID-19 negative, CCM recommended to transfer to noncovid floor for BiPAP  Active Problems:   Acute CHF (congestive heart failure) (HCC) -Continue Lasix 80 mg every 12 hours IV  -Ruled out for COVID, will obtain 2D echo  -Continue strict I's and O's and daily weights, diuresing well, almost 10 L negative per day    Influenza a and B Continue Tamiflu 75 mg twice a day    Essential hypertension Continue Lasix    Morbid obesity Counseled strongly on diet and weight control  Code Status: Full CODE STATUS DVT Prophylaxis: Heparin Family Communication: Discussed in detail with the patient, all imaging results, lab results explained to the patient    Disposition Plan: Transfer to stepdown unit, needs BiPAP,  COVID negative   Time Spent in minutes: 35 minutes  Procedures:  CT angiogram of the chest  Consultants:   Pulmonary critical care  Antimicrobials:   Anti-infectives (From admission, onward)   Start     Dose/Rate Route Frequency  Ordered Stop   09/21/18 2200  hydroxychloroquine (PLAQUENIL) tablet 200 mg  Status:  Discontinued     200 mg Oral 2 times daily 09/20/18 1543 09/21/18 1038   09/21/18 1000  azithromycin (ZITHROMAX) tablet 250 mg  Status:  Discontinued     250 mg Oral Daily 09/20/18 1334 09/21/18 1038   09/20/18 1600  hydroxychloroquine (PLAQUENIL) tablet 400 mg     400 mg Oral 2 times daily 09/20/18 1543 09/21/18 1033   09/20/18 1400  azithromycin (ZITHROMAX) tablet 500 mg     500 mg Oral Daily 09/20/18 1334 09/20/18 1513   09/19/18 2200  oseltamivir (TAMIFLU) capsule 75 mg     75 mg Oral 2 times daily 09/19/18 1052 09/24/18 2159         Medications  Scheduled Meds: . aspirin EC  81 mg Oral Daily  .  enoxaparin (LOVENOX) injection  40 mg Subcutaneous Q24H  . furosemide  80 mg Intravenous Q12H  . loratadine  10 mg Oral Daily  . montelukast  10 mg Oral QHS  . oseltamivir  75 mg Oral BID  . sodium chloride flush  3 mL Intravenous Q12H  . sodium chloride flush  3 mL Intravenous Q12H   Continuous Infusions: . sodium chloride     PRN Meds:.sodium chloride, acetaminophen, albuterol, ondansetron **OR** ondansetron (ZOFRAN) IV, sodium chloride flush, sodium chloride flush      Subjective:   Frank Watts was seen and examined today.  On 15 L O2 via nonrebreather, short of breath and somewhat confused today.  Difficult to obtain review of system from the patient.  Afebrile. Objective:   Vitals:   09/20/18 2358 09/21/18 0349 09/21/18 0630 09/21/18 1300  BP: (!) 141/86 138/80  134/66  Pulse: 92 92  90  Resp: _0 Temp: 98.8 F (37.1 C) 97.9 F (36.6 C)    TempSrc: Oral Oral    SpO2: 91% 95%  93%  Weight:   (!) 231.5 kg (!) 230.2 kg  Height:    _1  (1.778 m)    Intake/Output Summary (Last 24 hours) at 09/21/2018 1429 Last data filed at 09/21/2018 1300 Gross per 24 hour  Intake 960 ml  Output 6110 ml  Net -5150 ml     Wt Readings from Last 3 Encounters:  09/21/18 (!) 230.2 kg   Physical Exam  General: Alert and oriented x 3, NAD  Eyes:   HEENT:    Cardiovascular: S1 S2 clear, RRR  Respiratory: Clear to auscultation anteriorly  Gastrointestinal: Morbidly obese, soft, nontender, nondistended, NBS  Ext: 2+ pedal edema bilaterally  Neuro: no new deficits  Musculoskeletal: No cyanosis, clubbing  Skin: No rashes  Psych: Normal affect and demeanor, alert and oriented x3    Data Reviewed:  I have personally reviewed following labs and imaging studies  Micro Results No results found for this or any previous visit (from the past 240 hour(s)).  Radiology Reports Ct Angio Chest Pe W Or Wo Contrast  Result Date: 09/20/2018 CLINICAL DATA:  Inpatient.   Dyspnea. EXAM: CT ANGIOGRAPHY CHEST WITH CONTRAST TECHNIQUE: Multidetector CT imaging of the chest was performed using the standard protocol during bolus administration of intravenous contrast. Multiplanar CT image reconstructions and MIPs were obtained to evaluate the vascular anatomy. CONTRAST:  76m OMNIPAQUE IOHEXOL 350 MG/ML SOLN COMPARISON:  Chest radiograph from one day prior. FINDINGS: Cardiovascular: The study is low to moderate quality for the evaluation of pulmonary embolism, limited by motion degradation and patient body  habitus. There are no convincing filling defects in the central, lobar, segmental or subsegmental pulmonary artery branches to suggest acute pulmonary embolism. Normal course and caliber of the thoracic aorta. Dilated main pulmonary artery (4.3 cm diameter). Mild cardiomegaly. No significant pericardial fluid/thickening. Mediastinum/Nodes: No discrete thyroid nodules. Unremarkable esophagus. No pathologically enlarged axillary, mediastinal or hilar lymph nodes. Lungs/Pleura: No pneumothorax. No pleural effusion. There is patchy ground-glass opacity in both lungs, most prominent in right middle lobe. Scattered small parenchymal bands in left upper and right lower lobes compatible with mild scarring or atelectasis. No lung masses or significant pulmonary nodules. No consolidative airspace disease. Upper abdomen: No acute abnormality. Musculoskeletal:  No aggressive appearing focal osseous lesions. Review of the MIP images confirms the above findings. IMPRESSION: 1. Limited scan.  No evidence of pulmonary embolism. 2. Dilated main pulmonary artery, suggesting pulmonary arterial hypertension. 3. Patchy ground-glass opacity in both lungs, most prominent in the right middle lobe. Consider pulmonary edema given the cardiomegaly, with the differential including atypical infection such as viral pneumonia. Electronically Signed   By: Ilona Sorrel M.D.   On: 09/20/2018 11:53    Lab Data:  CBC:  Recent Labs  Lab 09/19/18 1353 09/20/18 1018 09/21/18 0005  WBC 8.8 7.9 8.5  NEUTROABS 6.8 5.7 6.3  HGB 17.4* 16.7 17.7*  HCT 60.1* 57.8* 61.0*  MCV 83.9 86.4 83.6  PLT 203 188 443   Basic Metabolic Panel: Recent Labs  Lab 09/19/18 1353 09/20/18 0621 09/21/18 0005  NA 147* 145 142  K 4.3 4.2 3.9  CL 97* 94* 89*  CO2 37* 37* 36*  GLUCOSE 102* 99 88  BUN _0 CREATININE 1.27* 1.13 1.12  CALCIUM 9.4 9.2 9.6   GFR: Estimated Creatinine Clearance: 170.2 mL/min (by C-G formula based on SCr of 1.12 mg/dL). Liver Function Tests: Recent Labs  Lab 09/19/18 1353 09/20/18 0621 09/21/18 0005  AST _1 ALT 34 30 31  ALKPHOS 61 56 61  BILITOT 0.9 1.2 1.1  PROT 6.0* 5.7* 6.4*  ALBUMIN 3.4* 3.1* 3.6   No results for input(s): LIPASE, AMYLASE in the last 168 hours. No results for input(s): AMMONIA in the last 168 hours. Coagulation Profile: No results for input(s): INR, PROTIME in the last 168 hours. Cardiac Enzymes: Recent Labs  Lab 09/20/18 1414 09/21/18 0005 09/21/18 0802  CKTOTAL 136  --   --   TROPONINI 0.03* <0.03 0.03*   BNP (last 3 results) No results for input(s): PROBNP in the last 8760 hours. HbA1C: No results for input(s): HGBA1C in the last 72 hours. CBG: No results for input(s): GLUCAP in the last 168 hours. Lipid Profile: No results for input(s): CHOL, HDL, LDLCALC, TRIG, CHOLHDL, LDLDIRECT in the last 72 hours. Thyroid Function Tests: No results for input(s): TSH, T4TOTAL, FREET4, T3FREE, THYROIDAB in the last 72 hours. Anemia Panel: Recent Labs    09/19/18 1353 09/21/18 0802  FERRITIN 33 41   Urine analysis: No results found for: COLORURINE, APPEARANCEUR, LABSPEC, PHURINE, GLUCOSEU, HGBUR, BILIRUBINUR, KETONESUR, PROTEINUR, UROBILINOGEN, NITRITE, LEUKOCYTESUR   Kristi Norment M.D. [facility] 09/21/2018, 2:29 PM  Pager: [phone] Between 7am to 7pm - call Pager - [phone]  After 7pm go to [email or web address] - password [email or web address]   Call night coverage person covering after 7pm

## 2018-09-21 NOTE — Consult Note (Signed)
PULMONARY / CRITICAL CARE MEDICINE   NAME:  Frank Watts, MRN:  [id number], DOB:  04/15/1981, LOS: 2 ADMISSION DATE:  09/19/2018, CONSULTATION DATE:  09/21/2018 REFERRING MD:  Dr. Tana Coast, [facility], CHIEF COMPLAINT:  Short of breath  BRIEF HISTORY:    38 yo male had cough and shortness of breath for 1 month.  Presented to [facility] hospital 09/18/18.  Found to have influenza A and B positive.  Denies recent sick contacts or travel history.  Transferred from [place] to [facility] 4/10 due to hypoxia and assess for COVID-19; and this was negative.  He is morbidly obese.  Likely has sleep disordered breathing.  ABG from 09/21/18 showed acute on chronic hypercapnia.  HISTORY OF PRESENT ILLNESS   38 yo male had cough and shortness of breath for 1 month.  Presented to [facility] hospital 09/18/18.  Found to have influenza A and B positive.  Denies recent sick contacts or travel history.  Transferred from [place] to [facility] 4/10 due to hypoxia and assess for COVID-19.  He snores.  Has trouble with his sleep.  Had intermittent cough with clear sputum.  Has occasional wheezing.  He is not aware of any health problems, but hasn't f/u with doctor.  Denies smoking.  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  He has chronic leg swelling, and loose BMs.  Not having chest pain, sweats.  Not aware of having fever.  SIGNIFICANT PAST MEDICAL HISTORY   HTN  SIGNIFICANT EVENTS:  4/10 Transfer from [place]:   CT angio chest 4/11 >> faint/patchy GGO b/l, tracheobronchomalacia, CM, dilated PA  CULTURES:  Influenza PCR Oval Linsey) 4/09 >> Influenza A and B COVID 4/09 ([place]) >> negative Respiratory viral panel 4/11 >>   ANTIBIOTICS:  Tamiflu 4/10 >> Plaquenil 4/11 >> Zithromax 4/11 >>  LINES/TUBES:    CONSULTANTS:    SUBJECTIVE:    CONSTITUTIONAL: BP 138/80 (BP Location: Left Arm)   Pulse 92   Temp 97.9 F (36.6 C) (Oral)   Resp 18   Ht _0  (1.778 m)    Wt (!) 231.5 kg   SpO2 95%   BMI 73.23 kg/m   I/O last 3 completed shifts: In: 96 [P.O.:1780; I.V.:120] Out: 34287 [Urine:11475]        PHYSICAL EXAM:  General - alert, sitting up in bed, talking on phone with is family Eyes - pupils reactive ENT - no sinus tenderness, no stridor Cardiac - regular rate/rhythm, no murmur Chest - decreased BS, faint basilar rhonchi, no wheeze Abdomen - soft, non tender, + bowel sounds Extremities - 3+ non pitting edema Skin - acanthosis nigricans in axillary regions Neuro - normal strength, moves extremities, follows commands Lymphatics - no lymphadenopathy Psych - normal mood and behavior   DISCUSSION  38 yo male with acute on chronic hypoxic/hypercapnic respiratory failure.  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  He has influenza A and B positive testing from [place].  However he reports his symptoms started about 1 month ago.  He currently has minimal symptoms, and no fever.  His COVID testing from [place] negative.  ASSESSMENT AND PLAN    Acute on chronic hypoxic/hypercapnic respiratory failure. Presumed OSA/OHS. Tracheobronchomalacia. Plan - oxygen to keep SpO2 88 to 95% - Bipap qhs and prn  WHO group 2 and 3 pulmonary hypertension. Plan - check Echo  Influenza A and B. Plan - tamiflu per primary team - d/c zithromax, plaquenil, acemra  Polycythemia likely from chronic hypoxia. Plan - f/u CBC   Best Practice / Goals of Care / Disposition.   DVT PROPHYLAXIS: Lovenox SUP: Not indicated NUTRITION: Heart healthy MOBILITY: OOB GOALS OF CARE: full code FAMILY DISCUSSIONS: no family at bedside DISPOSITION: Can keep in SDU  LABS  Glucose No results for input(s): GLUCAP in the last 168 hours.  BMET Recent Labs  Lab 09/19/18 1353 09/20/18 0621 09/21/18 0005  NA 147* 145 142  K 4.3 4.2 3.9  CL 97* 94* 89*  CO2 37* 37* 36*  BUN _0 CREATININE 1.27* 1.13 1.12  GLUCOSE 102* 99 88    Liver Enzymes Recent Labs  Lab 09/19/18 1353 09/20/18 0621 09/21/18 0005  AST _1 ALT 34 30 31  ALKPHOS 61 56 61  BILITOT 0.9 1.2 1.1  ALBUMIN 3.4* 3.1* 3.6    Electrolytes Recent Labs  Lab 09/19/18 1353 09/20/18 0621 09/21/18 0005  CALCIUM 9.4 9.2 9.6    CBC Recent Labs  Lab 09/19/18 1353 09/20/18 1018 09/21/18 0005  WBC 8.8 7.9 8.5  HGB 17.4* 16.7 17.7*  HCT 60.1* 57.8* 61.0*  PLT 203 188 187    ABG Recent Labs  Lab 09/21/18 0938  PHART 7.274*  PCO2ART 100*  PO2ART 120*    Coag's No results for input(s): APTT, INR in the last 168 hours.  Sepsis Markers Recent Labs  Lab 09/20/18 1414  PROCALCITON <0.10    Cardiac Enzymes Recent Labs  Lab 09/20/18 1414 09/21/18 0005 09/21/18 0802  TROPONINI 0.03* <0.03 0.03*    PAST MEDICAL HISTORY :   He  has a past medical history of HTN (hypertension).  PAST SURGICAL HISTORY:  He has not had any prior surgeries.  No Known Allergies  No current facility-administered medications on file prior to encounter.    Current Outpatient Medications on File Prior to Encounter  Medication Sig  . PROAIR HFA 108 (90 Base) MCG/ACT inhaler Inhale 2 puffs into the lungs every 6 (six) hours as needed for wheezing or shortness of breath.    FAMILY HISTORY:   His family history includes Hypertension in his mother; Prostate cancer in his father.  SOCIAL HISTORY:  He  reports that he has never smoked. He has never used smokeless tobacco.  REVIEW OF SYSTEMS:    Negative except in HPI   Chesley Mires, MD [place] 09/21/2018, 10:42 AM

## 2018-09-21 NOTE — Progress Notes (Signed)
This RN called [facility] and spoke with "Scotty" in lab there. Pt was tested for COVID 19 at [facility] and that test has resulted negative.

## 2018-09-21 NOTE — Progress Notes (Signed)
This RN received call from tele at approx 0920. Pt SpO2 46% with good wave form. Upon entering the room pt found to be on 10L HFNC. Pt then placed on NRB at 15L. MD Rai notified and STAT ABG orders obtained and Critical Care consulted.

## 2018-09-21 NOTE — Progress Notes (Signed)
Pt. Refused bipap. Pt. States he prefers to just wear his cannula. RN is aware.

## 2018-09-21 NOTE — Progress Notes (Signed)
  Echocardiogram 2D Echocardiogram has been performed with Definity.  Frank Watts 09/21/2018, 3:06 PM

## 2018-09-21 NOTE — Progress Notes (Signed)
Pt with CO2 of 100 and PaO2 of 120 on 100% NRB, results called to RN.  Pt does not need to be on NRB because he is a CO2 retainer.  RN instructed to turn pts O2 down and place back on HFNC.  RT will continue to monitor.

## 2018-09-22 ENCOUNTER — Inpatient Hospital Stay (HOSPITAL_COMMUNITY): Payer: PRIVATE HEALTH INSURANCE

## 2018-09-22 DIAGNOSIS — I5031 Acute diastolic (congestive) heart failure: Secondary | ICD-10-CM

## 2018-09-22 DIAGNOSIS — R609 Edema, unspecified: Secondary | ICD-10-CM

## 2018-09-22 HISTORY — DX: Acute diastolic (congestive) heart failure: I50.31

## 2018-09-22 LAB — BASIC METABOLIC PANEL
Anion gap: 12 (ref 5–15)
BUN: 11 mg/dL (ref 6–20)
CO2: 46 mmol/L — ABNORMAL HIGH (ref 22–32)
Calcium: 9.7 mg/dL (ref 8.9–10.3)
Chloride: 85 mmol/L — ABNORMAL LOW (ref 98–111)
Creatinine, Ser: 1.21 mg/dL (ref 0.61–1.24)
GFR calc Af Amer: >60 mL/min (ref >60)
GFR calc non Af Amer: >60 mL/min (ref >60)
Glucose, Bld: 122 mg/dL — ABNORMAL HIGH (ref 70–99)
Potassium: 4 mmol/L (ref 3.5–5.1)
Sodium: 143 mmol/L (ref 135–145)

## 2018-09-22 LAB — INTERLEUKIN-6, PLASMA: Interleukin-6, Plasma: 138.2 pg/mL — ABNORMAL HIGH (ref 0.0–12.2)

## 2018-09-22 LAB — LIPID PANEL
Cholesterol: 144 mg/dL (ref 0–200)
HDL: 34 mg/dL — ABNORMAL LOW (ref >40)
LDL Cholesterol: 95 mg/dL (ref 0–99)
Total CHOL/HDL Ratio: 4.2 RATIO
Triglycerides: 76 mg/dL (ref <150)
VLDL: 15 mg/dL (ref 0–40)

## 2018-09-22 LAB — HEMOGLOBIN A1C
Hgb A1c MFr Bld: 6.2 % — ABNORMAL HIGH (ref 4.8–5.6)
Mean Plasma Glucose: 131 mg/dL

## 2018-09-22 LAB — MAGNESIUM: Magnesium: 2.1 mg/dL (ref 1.7–2.4)

## 2018-09-22 LAB — CBC
HCT: 59.3 % — ABNORMAL HIGH (ref 39.0–52.0)
Hemoglobin: 17.6 g/dL — ABNORMAL HIGH (ref 13.0–17.0)
MCH: 25.4 pg — ABNORMAL LOW (ref 26.0–34.0)
MCHC: 29.7 g/dL — ABNORMAL LOW (ref 30.0–36.0)
MCV: 85.6 fL (ref 80.0–100.0)
Platelets: 185 10*3/uL (ref 150–400)
RBC: 6.93 MIL/uL — ABNORMAL HIGH (ref 4.22–5.81)
RDW: 19.6 % — ABNORMAL HIGH (ref 11.5–15.5)
WBC: 8.3 10*3/uL (ref 4.0–10.5)
nRBC: 0 % (ref 0.0–0.2)

## 2018-09-22 LAB — D-DIMER, QUANTITATIVE
D-Dimer, Quant: 2.15 ug/mL-FEU — ABNORMAL HIGH (ref 0.00–0.50)
D-Dimer, Quant: 2.5 ug/mL-FEU — ABNORMAL HIGH (ref 0.00–0.50)

## 2018-09-22 MED ORDER — FUROSEMIDE 10 MG/ML IJ SOLN
80.0000 mg | Freq: Three times a day (TID) | INTRAMUSCULAR | Status: DC
  Administered 2018-09-22 – 2018-09-24 (×9): 80 mg via INTRAVENOUS
  Filled 2018-09-22 (×8): qty 8

## 2018-09-22 MED ORDER — SALINE SPRAY 0.65 % NA SOLN
1.0000 | NASAL | Status: DC | PRN
  Filled 2018-09-22 (×2): qty 44

## 2018-09-22 MED ORDER — ENOXAPARIN SODIUM 120 MG/0.8ML ~~LOC~~ SOLN
115.0000 mg | SUBCUTANEOUS | Status: DC
  Administered 2018-09-22 – 2018-09-25 (×4): 115 mg via SUBCUTANEOUS
  Filled 2018-09-22 (×4): qty 0.77

## 2018-09-22 NOTE — Plan of Care (Signed)

## 2018-09-22 NOTE — Plan of Care (Signed)
Nutrition Education Note  RD consulted for nutrition education regarding weight loss and CHF.  Spoke with pt at bedside. Pt had just finished eating lunch meal.  Pt reports that he has been researching his condition and knows that he needs to reduce his sodium intake because sodium retains fluid.  Pt reports he knows that soy sauce is "off-limits" and is able to name several other high-sodium foods that he should stay away from including seasonings with salt added, [place] fries, and chips.  RD provided "Low Sodium Nutrition Therapy" handout from the Academy of Nutrition and Dietetics. Reviewed patient's dietary recall. Provided examples on ways to decrease sodium intake in diet. Discouraged intake of processed foods and use of salt shaker. Encouraged fresh fruits and vegetables as well as whole grain sources of carbohydrates to maximize fiber intake.  RD discussed why it is important for patient to adhere to diet recommendations, and emphasized the role of fluids, foods to avoid, and importance of weighing self daily.  Body mass index is 72.63 kg/m. Pt meets criteria for obesity class III based on current BMI.  RD also discussed general, healthful nutrition as it relates to weight loss. Emphasized the importance of serving sizes. Discussed importance of controlled and consistent intake throughout the day. Provided examples of ways to balance meals/snacks and encouraged intake of high-fiber, whole grain complex carbohydrates. Emphasized the importance of hydration with calorie-free beverages while also adhering to fluid status. Encouraged pt to discuss physical activity options with physician. Teach back method used.  Expect fair to good compliance.  Current diet order is Heart Healthy with 1200 ml fluid restriction, patient is consuming approximately 100% of meals at this time. Labs and medications reviewed. No further nutrition interventions warranted at this time. RD contact information  provided. If additional nutrition issues arise, please re-consult RD.   Gaynell Face, MS, RD, LDN Inpatient Clinical Dietitian Pager: [phone] Weekend/After Hours: [phone]

## 2018-09-22 NOTE — Progress Notes (Signed)
PROGRESS NOTE    Frank Watts  OZD:[id number] DOB: [birth year] DOA: 09/19/2018 PCP: No primary care provider on file.   Brief Narrative:  38 year old BM PMHx HTN, MORBID OBESITY, has not seen primary care physician in many years   Presented to [facility] ED with several week history of shortness of breath.  Patient had been prescribed steroids, antibiotics and albuterol outpatient by [facility], patient works for [employer]  Patient denies any fevers or sick contacts, reports lower extremity peripheral edema and shortness of breath In ED, patient required up to 6 L nasal cannula to maintain sats 100% [facility] ED work-up showed sodium 141, WBCs 10.4, creatinine 1.3, AST 33, ALT 42, LDH 871, troponin 0.03, CRP 33.4, pro BNP 5690, procalcitonin 0.21 Influenza A and B both+.  D-dimer 2.13 COVID-19 NEGATIVE (confirmed with Oval Linsey today by charge nurse) Chest x-ray showed cardiomegaly with questionable left suprahilar opacity    Subjective: 4/13+ S OB, negative CP, negative abdominal pain.  States does not have a cardiologist therefore does not know base weight, or weigh himself daily.  Does not have CPAP nor has he ever had a sleep study.   Assessment & Plan:   Principal Problem:   Acute hypoxemic respiratory failure (HCC) Active Problems:   Acute CHF (congestive heart failure) (HCC)   Influenza   Essential hypertension   Morbid obesity   Acute diastolic CHF (congestive heart failure) (HCC)  Acute respiratory failure with hypoxia and hypercarbic -Multifactorial influenza pneumonia, MORBID OBESITY, OSA/OHS, diastolic CHF -Treat underlying causes  Influenza A/B positive/left-sided pneumonia -Patient initially placed on Zithromax and Plaquenil - Negative COVID-19 study per notes therefore Zithromax and Plaquenil discontinued - Complete Tamiflu 75 mg twice daily x5 days  OSA/OHS -Initial ABG showed CO2 retention, PCO2 100 - CPAP/BiPAP per respiratory - Schedule  establish care  with PCCM ([place]) for spirometry pre-/post bronchodilator, DLCO, sleep study 6 week post discharge   Acute diastolic CHF - Unknown based weight, patient without cardiologist - Increase Lasix 80 mg 3 times daily -Strict in and out -11.3 L -Daily weight Filed Weights   09/21/18 0630 09/21/18 1300 09/22/18 0333  Weight: (!) 231.5 kg (!) 230.2 kg (!) 229.6 kg  -Monitor creatinine closely -A1c pending -Lipid panel pending -Heart healthy diet -Consult cardiology in order to tie patient into cardiology system for follow-up  Essential HTN See CHF  Morbid obesity - Consult placed to nutrition for heart healthy diet/weight loss diet - Heart healthy diet: Calories ARE NOT TO EXCEED 2000 in a 24-hour period      DVT prophylaxis: Lovenox Code Status: Full Family Communication: None Disposition Plan: TBD   Consultants:  Cardiology spoke with Dr. Meda Coffee will schedule follow-up in [place]    Procedures/Significant Events:  4/11 CTA chest PE protocol: Negative PE - Cardiomegaly, bilateral patchy groundglass opacity: Pulmonary edema vs atypical infection such as viral pneumonia 4/12 echocardiogram:-Left ventricle has hyperdynamic systolic function, with an ejection fraction of >65%. The cavity size was normal.  - moderate concentric LVH. Left ventricular diastolic Doppler parameters are consistent with  pseudonormalization.  -Elevated mean left atrial pressure T -Right ventricular pressure overload.  4/13  lower extremity ultrasound pending   I have personally reviewed and interpreted all radiology studies and my findings are as above.  VENTILATOR SETTINGS: None HF [place] 6 L O2 per minute   Cultures COVID-19 NEGATIVE Influenza A/B positive    Antimicrobials: Anti-infectives (From admission, onward)   Start     Stop   09/21/18 2200  hydroxychloroquine (PLAQUENIL) tablet 200 mg  Status:  Discontinued     09/21/18 1038   09/21/18 1000  azithromycin  (ZITHROMAX) tablet 250 mg  Status:  Discontinued     09/21/18 1038   09/20/18 1600  hydroxychloroquine (PLAQUENIL) tablet 400 mg     09/21/18 1033   09/20/18 1400  azithromycin (ZITHROMAX) tablet 500 mg     09/20/18 1513   09/19/18 2200  oseltamivir (TAMIFLU) capsule 75 mg     09/24/18 2159        Devices    LINES / TUBES:      Continuous Infusions:   Objective: Vitals:   09/21/18 2000 09/21/18 2336 09/22/18 0333 09/22/18 1135  BP:  (!) 143/79 (!) 118/59 (!) 133/91  Pulse:  93 96 92  Resp:  (!) 22 (!) 30 20  Temp:  98 F (36.7 C) 98.7 F (37.1 C) 97.8 F (36.6 C)  TempSrc:  Oral Oral Oral  SpO2: 94% 95% 100% 96%  Weight:   (!) 229.6 kg   Height:        Intake/Output Summary (Last 24 hours) at 09/22/2018 1456 Last data filed at 09/22/2018 1137 Gross per 24 hour  Intake 960 ml  Output 2575 ml  Net -1615 ml   Filed Weights   09/21/18 0630 09/21/18 1300 09/22/18 0333  Weight: (!) 231.5 kg (!) 230.2 kg (!) 229.6 kg    Examination:  General: A/O x4, positive acute respiratory distress Eyes: negative scleral hemorrhage, negative anisocoria, negative icterus ENT: Negative Runny nose, negative gingival bleeding, Neck:  Negative scars, masses, torticollis, lymphadenopathy, JVD Lungs: Secondary to body habitus difficult to auscultate, poor air movement diffusely, negative wheeze, negative crackles  Cardiovascular: Regular rate and rhythm without murmur gallop or rub normal S1 and S2 Abdomen: MORBIDLY OBESE, negative abdominal pain, nondistended, positive soft, bowel sounds, no rebound, no ascites, no appreciable mass Extremities: No significant cyanosis, clubbing.  Secondary to body habitus unable to accurately gauge bilateral pedal edema Skin: Negative rashes, lesions, ulcers Psychiatric:  Negative depression, negative anxiety, negative fatigue, negative mania  Central nervous system:  Cranial nerves II through XII intact, tongue/uvula midline, all extremities  muscle strength 5/5, sensation intact throughout, negative dysarthria, negative expressive aphasia, negative receptive aphasia.  .     Data Reviewed: Care during the described time interval was provided by me .  I have reviewed this patient's available data, including medical history, events of note, physical examination, and all test results as part of my evaluation.   CBC: Recent Labs  Lab 09/19/18 1353 09/20/18 1018 09/21/18 0005 09/22/18 0758  WBC 8.8 7.9 8.5 8.3  NEUTROABS 6.8 5.7 6.3  --   HGB 17.4* 16.7 17.7* 17.6*  HCT 60.1* 57.8* 61.0* 59.3*  MCV 83.9 86.4 83.6 85.6  PLT 203 188 187 370   Basic Metabolic Panel: Recent Labs  Lab 09/19/18 1353 09/20/18 0621 09/21/18 0005 09/22/18 0758  NA 147* 145 142 143  K 4.3 4.2 3.9 4.0  CL 97* 94* 89* 85*  CO2 37* 37* 36* 46*  GLUCOSE 102* 99 88 122*  BUN _0 CREATININE 1.27* 1.13 1.12 1.21  CALCIUM 9.4 9.2 9.6 9.7  MG  --   --   --  2.1   GFR: Estimated Creatinine Clearance: 157.2 mL/min (by C-G formula based on SCr of 1.21 mg/dL). Liver Function Tests: Recent Labs  Lab 09/19/18 1353 09/20/18 0621 09/21/18 0005  AST _1 ALT 34 30 31  ALKPHOS 61 56 61  BILITOT 0.9 1.2 1.1  PROT 6.0* 5.7* 6.4*  ALBUMIN 3.4* 3.1* 3.6   No results for input(s): LIPASE, AMYLASE in the last 168 hours. No results for input(s): AMMONIA in the last 168 hours. Coagulation Profile: No results for input(s): INR, PROTIME in the last 168 hours. Cardiac Enzymes: Recent Labs  Lab 09/20/18 1414 09/21/18 0005 09/21/18 0802  CKTOTAL 136  --   --   TROPONINI 0.03* <0.03 0.03*   BNP (last 3 results) No results for input(s): PROBNP in the last 8760 hours. HbA1C: No results for input(s): HGBA1C in the last 72 hours. CBG: No results for input(s): GLUCAP in the last 168 hours. Lipid Profile: Recent Labs    09/22/18 0758  CHOL 144  HDL 34*  LDLCALC 95  TRIG 76  CHOLHDL 4.2   Thyroid Function Tests: No results for  input(s): TSH, T4TOTAL, FREET4, T3FREE, THYROIDAB in the last 72 hours. Anemia Panel: Recent Labs    09/21/18 0802  FERRITIN 41   Urine analysis: No results found for: COLORURINE, APPEARANCEUR, LABSPEC, PHURINE, GLUCOSEU, HGBUR, BILIRUBINUR, KETONESUR, PROTEINUR, UROBILINOGEN, NITRITE, LEUKOCYTESUR Sepsis Labs: _0 (procalcitonin:4,lacticidven:4)  )No results found for this or any previous visit (from the past 240 hour(s)).       Radiology Studies: Vas [place] Lower Extremity Venous (dvt)  Result Date: 09/22/2018  Lower Venous Study Indications: Edema.  Limitations: Body habitus and poor ultrasound/tissue interface. Performing Technologist: Oliver Hum RVT  Examination Guidelines: A complete evaluation includes B-mode imaging, spectral Doppler, color Doppler, and power Doppler as needed of all accessible portions of each vessel. Bilateral testing is considered an integral part of a complete examination. Limited examinations for reoccurring indications may be performed as noted.  Right Venous Findings: +---------+---------------+---------+-----------+----------+--------------+            Compressibility Phasicity Spontaneity Properties Summary         +---------+---------------+---------+-----------+----------+--------------+  CFV       Full            Yes       Yes                                    +---------+---------------+---------+-----------+----------+--------------+  SFJ       Full                                                             +---------+---------------+---------+-----------+----------+--------------+  FV Prox   Full                                                             +---------+---------------+---------+-----------+----------+--------------+  FV Mid    Full                                                             +---------+---------------+---------+-----------+----------+--------------+  FV Distal Full  Yes       Yes                                     +---------+---------------+---------+-----------+----------+--------------+  PFV       Full                                                             +---------+---------------+---------+-----------+----------+--------------+  POP       Full            Yes       Yes                                    +---------+---------------+---------+-----------+----------+--------------+  PTV       Full                                                             +---------+---------------+---------+-----------+----------+--------------+  PERO                                                       Not visualized  +---------+---------------+---------+-----------+----------+--------------+  Left Venous Findings: +---------+---------------+---------+-----------+----------+--------------+            Compressibility Phasicity Spontaneity Properties Summary         +---------+---------------+---------+-----------+----------+--------------+  CFV       Full            Yes       Yes                                    +---------+---------------+---------+-----------+----------+--------------+  SFJ       Full                                                             +---------+---------------+---------+-----------+----------+--------------+  FV Prox   Full                                                             +---------+---------------+---------+-----------+----------+--------------+  FV Mid    Full                                                             +---------+---------------+---------+-----------+----------+--------------+  FV Distal Full                                                             +---------+---------------+---------+-----------+----------+--------------+  PFV       Full                                                             +---------+---------------+---------+-----------+----------+--------------+  POP       Full            Yes       Yes                                     +---------+---------------+---------+-----------+----------+--------------+  PTV       Full                                                             +---------+---------------+---------+-----------+----------+--------------+  PERO                                                       Not visualized  +---------+---------------+---------+-----------+----------+--------------+    Summary: Right: There is no evidence of deep vein thrombosis in the lower extremity. However, portions of this examination were limited- see technologist comments above. No cystic structure found in the popliteal fossa. Left: There is no evidence of deep vein thrombosis in the lower extremity. However, portions of this examination were limited- see technologist comments above. No cystic structure found in the popliteal fossa.  *See table(s) above for measurements and observations.    Preliminary         Scheduled Meds:  aspirin EC  81 mg Oral Daily   enoxaparin (LOVENOX) injection  115 mg Subcutaneous Q24H   furosemide  80 mg Intravenous TID   loratadine  10 mg Oral Daily   montelukast  10 mg Oral QHS   oseltamivir  75 mg Oral BID   Continuous Infusions:   LOS: 3 days   Time spent: 40 minutes     Immanuel Fedak, Geraldo Docker, MD [facility] Pager [phone]  If 7PM-7AM, please contact night-coverage [email or web address] Password [facility] 09/22/2018, 2:56 PM

## 2018-09-22 NOTE — Progress Notes (Signed)
Bilateral lower extremity venous duplex has been completed. Preliminary results can be found in CV Proc through chart review.   09/22/18 9:48 AM Frank Watts RVT

## 2018-09-23 DIAGNOSIS — R7303 Prediabetes: Secondary | ICD-10-CM

## 2018-09-23 DIAGNOSIS — Z6841 Body Mass Index (BMI) 40.0 and over, adult: Secondary | ICD-10-CM

## 2018-09-23 DIAGNOSIS — E7800 Pure hypercholesterolemia, unspecified: Secondary | ICD-10-CM

## 2018-09-23 DIAGNOSIS — D582 Other hemoglobinopathies: Secondary | ICD-10-CM

## 2018-09-23 LAB — CBC
HCT: 57.8 % — ABNORMAL HIGH (ref 39.0–52.0)
Hemoglobin: 17.2 g/dL — ABNORMAL HIGH (ref 13.0–17.0)
MCH: 25 pg — ABNORMAL LOW (ref 26.0–34.0)
MCHC: 29.8 g/dL — ABNORMAL LOW (ref 30.0–36.0)
MCV: 83.9 fL (ref 80.0–100.0)
Platelets: 176 10*3/uL (ref 150–400)
RBC: 6.89 MIL/uL — ABNORMAL HIGH (ref 4.22–5.81)
RDW: 19.1 % — ABNORMAL HIGH (ref 11.5–15.5)
WBC: 8.7 10*3/uL (ref 4.0–10.5)
nRBC: 0 % (ref 0.0–0.2)

## 2018-09-23 LAB — BASIC METABOLIC PANEL
Anion gap: 13 (ref 5–15)
BUN: 12 mg/dL (ref 6–20)
CO2: 42 mmol/L — ABNORMAL HIGH (ref 22–32)
Calcium: 9.1 mg/dL (ref 8.9–10.3)
Chloride: 85 mmol/L — ABNORMAL LOW (ref 98–111)
Creatinine, Ser: 1.25 mg/dL — ABNORMAL HIGH (ref 0.61–1.24)
GFR calc Af Amer: >60 mL/min (ref >60)
GFR calc non Af Amer: >60 mL/min (ref >60)
Glucose, Bld: 96 mg/dL (ref 70–99)
Potassium: 5.6 mmol/L — ABNORMAL HIGH (ref 3.5–5.1)
Sodium: 140 mmol/L (ref 135–145)

## 2018-09-23 LAB — MAGNESIUM: Magnesium: 2.1 mg/dL (ref 1.7–2.4)

## 2018-09-23 MED ORDER — METFORMIN HCL 500 MG PO TABS
500.0000 mg | ORAL_TABLET | Freq: Two times a day (BID) | ORAL | Status: DC
  Administered 2018-09-23 – 2018-09-27 (×8): 500 mg via ORAL
  Filled 2018-09-23 (×9): qty 1

## 2018-09-23 MED ORDER — ATORVASTATIN CALCIUM 10 MG PO TABS
20.0000 mg | ORAL_TABLET | Freq: Every day | ORAL | Status: DC
  Administered 2018-09-23 – 2018-09-26 (×4): 20 mg via ORAL
  Filled 2018-09-23 (×5): qty 2

## 2018-09-23 MED ORDER — IPRATROPIUM-ALBUTEROL 0.5-2.5 (3) MG/3ML IN SOLN
3.0000 mL | Freq: Four times a day (QID) | RESPIRATORY_TRACT | Status: DC
  Administered 2018-09-23 – 2018-09-24 (×4): 3 mL via RESPIRATORY_TRACT
  Filled 2018-09-23 (×4): qty 3

## 2018-09-23 MED ORDER — METHYLPREDNISOLONE SODIUM SUCC 125 MG IJ SOLR
60.0000 mg | INTRAMUSCULAR | Status: AC
  Administered 2018-09-23 – 2018-09-25 (×3): 60 mg via INTRAVENOUS
  Filled 2018-09-23 (×3): qty 2

## 2018-09-23 NOTE — Progress Notes (Signed)
Patient agrees to try CPAP tonight but not ready at this time. States to return between 00:00-01:00.

## 2018-09-23 NOTE — Progress Notes (Signed)
PROGRESS NOTE    Frank Watts  GUR:[id number] DOB: 10-01-1980 DOA: 09/19/2018 PCP: No primary care provider on file.   Brief Narrative:  38 year old BM PMHx HTN, MORBID OBESITY, has not seen primary care physician in many years   Presented to [facility] ED with several week history of shortness of breath.  Patient had been prescribed steroids, antibiotics and albuterol outpatient by [facility], patient works for [employer]  Patient denies any fevers or sick contacts, reports lower extremity peripheral edema and shortness of breath In ED, patient required up to 6 L nasal cannula to maintain sats 100% [facility] ED work-up showed sodium 141, WBCs 10.4, creatinine 1.3, AST 33, ALT 42, LDH 871, troponin 0.03, CRP 33.4, pro BNP 5690, procalcitonin 0.21 Influenza A and B both+.  D-dimer 2.13 COVID-19 NEGATIVE (confirmed with Oval Linsey today by charge nurse) Chest x-ray showed cardiomegaly with questionable left suprahilar opacity    Subjective: 4/14 A/O x4, positive S OB (improved), negative CP, negative abdominal pain.  Refused CPAP last night.  .   Assessment & Plan:   Principal Problem:   Acute hypoxemic respiratory failure (HCC) Active Problems:   Acute CHF (congestive heart failure) (HCC)   Influenza   Essential hypertension   Morbid obesity   Acute diastolic CHF (congestive heart failure) (HCC)  Acute respiratory failure with hypoxia and hypercarbic -Multifactorial influenza pneumonia, MORBID OBESITY, OSA/OHS, diastolic CHF -Treat underlying causes  Influenza A/B positive/left-sided EGBTDVVOH@@@@@ -Patient initially placed on Zithromax and Plaquenil - Negative COVID-19 study per notes therefore Zithromax and Plaquenil discontinued - Complete Tamiflu 75 mg twice daily x5 days - Solu-Medrol 60 mg daily x3 days - DuoNeb QID -Flutter valve  OSA/OHS -Initial ABG showed CO2 retention, PCO2 100 - CPAP/BiPAP per respiratory - Schedule establish care  with PCCM  ([place]) for spirometry pre-/post bronchodilator, DLCO, sleep study 6 week post discharge -4/14 counseled patient that his refusal to use CPAP was putting him at high risk for stroke, or heart attack.  Patient agreed to use CPAP tonight.   Acute diastolic YWV@@@@ - Unknown based weight, patient without cardiologist - Increase Lasix 80 mg 3 times daily -Strict in and out -15.5 L -Daily weight Filed Weights   09/21/18 1300 09/22/18 0333 09/23/18 0300  Weight: (!) 230.2 kg (!) 229.6 kg (!) 227.3 kg  -Monitor creatinine closely -Heart healthy diet -Consult cardiology in order to tie patient into cardiology system for follow-up  Essential HTN See CHF  Morbid obesity - Consult placed to nutrition for heart healthy diet/weight loss diet - Heart healthy diet: Calories ARE NOT TO EXCEED 2000 in a 24-hour period  PXTGGYIRSWN@@@@ - 4/13 hemoglobin A1c= 6.4.  Per ADA guidelines patients with hemoglobin A1c between 5.7 and 6.4 or prediabetic - Metformin 500 mg twice daily - Nutrition consult placed for prediabetes and CHF nutrition education  IOE@@@@ - LDL not within ADA/AHA guideline - Lipitor milligrams daily  Chronic elevated hemoglobin@@@@ -Polycythemia vera vs COPD vs emphysema vs pulmonary fibrosis vs Heart disease, - Discussed case with Dr. Pearla Dubonnet Oncology concurs that the most likely cause of patient's elevated hemoglobin is his OSA/OHS + pneumonia.  Recommends that after resolution of pneumonia he have his PCP check his hemoglobin level in 30 days and if continued elevated hemoglobin then consult oncology as an outpatient.    DVT prophylaxis: Lovenox Code Status: Full Family Communication: None Disposition Plan: TBD   Consultants:  Cardiology spoke with Dr. Meda Coffee will schedule follow-up in [place] [facility] phone consult Dr.Ni Alvy Bimler  Procedures/Significant Events:  4/11 CTA chest PE protocol: Negative PE - Cardiomegaly, bilateral patchy groundglass  opacity: Pulmonary edema vs atypical infection such as viral pneumonia 4/12 echocardiogram:-Left ventricle has hyperdynamic systolic function, with an ejection fraction of >65%. The cavity size was normal.  - moderate concentric LVH. Left ventricular diastolic Doppler parameters are consistent with  pseudonormalization.  -Elevated mean left atrial pressure T -Right ventricular pressure overload.  4/13  lower extremity ultrasound pending   I have personally reviewed and interpreted all radiology studies and my findings are as above.  VENTILATOR SETTINGS: None HF [place] 6 L O2 per minute SPO2: 96%     Cultures COVID-19 NEGATIVE Influenza A/B positive    Antimicrobials: Anti-infectives (From admission, onward)   Start     Stop   09/21/18 2200  hydroxychloroquine (PLAQUENIL) tablet 200 mg  Status:  Discontinued     09/21/18 1038   09/21/18 1000  azithromycin (ZITHROMAX) tablet 250 mg  Status:  Discontinued     09/21/18 1038   09/20/18 1600  hydroxychloroquine (PLAQUENIL) tablet 400 mg     09/21/18 1033   09/20/18 1400  azithromycin (ZITHROMAX) tablet 500 mg     09/20/18 1513   09/19/18 2200  oseltamivir (TAMIFLU) capsule 75 mg     09/24/18 2159        Devices    LINES / TUBES:      Continuous Infusions:   Objective: Vitals:   09/22/18 2300 09/23/18 0050 09/23/18 0300 09/23/18 0734  BP:  121/67 (!) 109/48 136/86  Pulse: 90 91 96 95  Resp: (!) _0 Temp:  98.9 F (37.2 C) 98.4 F (36.9 C) 98.5 F (36.9 C)  TempSrc:  Oral Oral Oral  SpO2: 90% 95% 93% 96%  Weight:   (!) 227.3 kg   Height:        Intake/Output Summary (Last 24 hours) at 09/23/2018 0803 Last data filed at 09/23/2018 0736 Gross per 24 hour  Intake 600 ml  Output 4850 ml  Net -4250 ml   Filed Weights   09/21/18 1300 09/22/18 0333 09/23/18 0300  Weight: (!) 230.2 kg (!) 229.6 kg (!) 227.3 kg   General: A/O x4, positive acute respiratory distress Eyes: negative scleral  hemorrhage, negative anisocoria, negative icterus ENT: Negative Runny nose, negative gingival bleeding, Neck:  Negative scars, masses, torticollis, lymphadenopathy, JVD Lungs: Secondary to patient's habitus difficult to clearly auscultate but poor air movement diffusely, without wheezes or crackles Cardiovascular: Regular rate and rhythm without murmur gallop or rub normal S1 and S2 Abdomen: MORBIDLY OBESE, negative abdominal pain, nondistended, positive soft, bowel sounds, no rebound, no ascites, no appreciable mass Extremities: No significant cyanosis, clubbing.  2-3+,  Skin: Negative rashes, lesions, ulcers Psychiatric:  Negative depression, negative anxiety, negative fatigue, negative mania, poor understanding of multiple disease processes Central nervous system:  Cranial nerves II through XII intact, tongue/uvula midline, all extremities muscle strength 5/5, sensation intact throughout, negative dysarthria, negative expressive aphasia, negative receptive aphasia.       Data Reviewed: Care during the described time interval was provided by me .  I have reviewed this patient's available data, including medical history, events of note, physical examination, and all test results as part of my evaluation.   CBC: Recent Labs  Lab 09/19/18 1353 09/20/18 1018 09/21/18 0005 09/22/18 0758 09/23/18 0233  WBC 8.8 7.9 8.5 8.3 8.7  NEUTROABS 6.8 5.7 6.3  --   --   HGB 17.4* 16.7 17.7* 17.6* 17.2*  HCT 60.1* 57.8* 61.0* 59.3* 57.8*  MCV 83.9 86.4 83.6 85.6 83.9  PLT 203 188 187 185 742   Basic Metabolic Panel: Recent Labs  Lab 09/19/18 1353 09/20/18 0621 09/21/18 0005 09/22/18 0758 09/23/18 0233  NA 147* 145 142 143 140  K 4.3 4.2 3.9 4.0 5.6*  CL 97* 94* 89* 85* 85*  CO2 37* 37* 36* 46* 42*  GLUCOSE 102* 99 88 122* 96  BUN _0 CREATININE 1.27* 1.13 1.12 1.21 1.25*  CALCIUM 9.4 9.2 9.6 9.7 9.1  MG  --   --   --  2.1 2.1   GFR: Estimated Creatinine Clearance:  151.2 mL/min (A) (by C-G formula based on SCr of 1.25 mg/dL (H)). Liver Function Tests: Recent Labs  Lab 09/19/18 1353 09/20/18 0621 09/21/18 0005  AST _1 ALT 34 30 31  ALKPHOS 61 56 61  BILITOT 0.9 1.2 1.1  PROT 6.0* 5.7* 6.4*  ALBUMIN 3.4* 3.1* 3.6   No results for input(s): LIPASE, AMYLASE in the last 168 hours. No results for input(s): AMMONIA in the last 168 hours. Coagulation Profile: No results for input(s): INR, PROTIME in the last 168 hours. Cardiac Enzymes: Recent Labs  Lab 09/20/18 1414 09/21/18 0005 09/21/18 0802  CKTOTAL 136  --   --   TROPONINI 0.03* <0.03 0.03*   BNP (last 3 results) No results for input(s): PROBNP in the last 8760 hours. HbA1C: Recent Labs    09/22/18 0758  HGBA1C 6.2*   CBG: No results for input(s): GLUCAP in the last 168 hours. Lipid Profile: Recent Labs    09/22/18 0758  CHOL 144  HDL 34*  LDLCALC 95  TRIG 76  CHOLHDL 4.2   Thyroid Function Tests: No results for input(s): TSH, T4TOTAL, FREET4, T3FREE, THYROIDAB in the last 72 hours. Anemia Panel: Recent Labs    09/21/18 0802  FERRITIN 41   Urine analysis: No results found for: COLORURINE, APPEARANCEUR, LABSPEC, PHURINE, GLUCOSEU, HGBUR, BILIRUBINUR, KETONESUR, PROTEINUR, UROBILINOGEN, NITRITE, LEUKOCYTESUR Sepsis Labs: _2 (procalcitonin:4,lacticidven:4)  )No results found for this or any previous visit (from the past 240 hour(s)).       Radiology Studies: Vas [place] Lower Extremity Venous (dvt)  Result Date: 09/22/2018  Lower Venous Study Indications: Edema.  Limitations: Body habitus and poor ultrasound/tissue interface. Performing Technologist: Oliver Hum RVT  Examination Guidelines: A complete evaluation includes B-mode imaging, spectral Doppler, color Doppler, and power Doppler as needed of all accessible portions of each vessel. Bilateral testing is considered an integral part of a complete examination. Limited examinations for reoccurring  indications may be performed as noted.  Right Venous Findings: +---------+---------------+---------+-----------+----------+--------------+          CompressibilityPhasicitySpontaneityPropertiesSummary        +---------+---------------+---------+-----------+----------+--------------+ CFV      Full           Yes      Yes                                 +---------+---------------+---------+-----------+----------+--------------+ SFJ      Full                                                        +---------+---------------+---------+-----------+----------+--------------+ FV Prox  Full                                                        +---------+---------------+---------+-----------+----------+--------------+  FV Mid   Full                                                        +---------+---------------+---------+-----------+----------+--------------+ FV DistalFull           Yes      Yes                                 +---------+---------------+---------+-----------+----------+--------------+ PFV      Full                                                        +---------+---------------+---------+-----------+----------+--------------+ POP      Full           Yes      Yes                                 +---------+---------------+---------+-----------+----------+--------------+ PTV      Full                                                        +---------+---------------+---------+-----------+----------+--------------+ PERO                                                  Not visualized +---------+---------------+---------+-----------+----------+--------------+  Left Venous Findings: +---------+---------------+---------+-----------+----------+--------------+          CompressibilityPhasicitySpontaneityPropertiesSummary        +---------+---------------+---------+-----------+----------+--------------+ CFV      Full           Yes       Yes                                 +---------+---------------+---------+-----------+----------+--------------+ SFJ      Full                                                        +---------+---------------+---------+-----------+----------+--------------+ FV Prox  Full                                                        +---------+---------------+---------+-----------+----------+--------------+ FV Mid   Full                                                        +---------+---------------+---------+-----------+----------+--------------+  FV DistalFull                                                        +---------+---------------+---------+-----------+----------+--------------+ PFV      Full                                                        +---------+---------------+---------+-----------+----------+--------------+ POP      Full           Yes      Yes                                 +---------+---------------+---------+-----------+----------+--------------+ PTV      Full                                                        +---------+---------------+---------+-----------+----------+--------------+ PERO                                                  Not visualized +---------+---------------+---------+-----------+----------+--------------+    Summary: Right: There is no evidence of deep vein thrombosis in the lower extremity. However, portions of this examination were limited- see technologist comments above. No cystic structure found in the popliteal fossa. Left: There is no evidence of deep vein thrombosis in the lower extremity. However, portions of this examination were limited- see technologist comments above. No cystic structure found in the popliteal fossa.  *See table(s) above for measurements and observations. Electronically signed by Monica Martinez MD on 09/22/2018 at 5:15:27 PM.    Final         Scheduled Meds: . aspirin EC   81 mg Oral Daily  . enoxaparin (LOVENOX) injection  115 mg Subcutaneous Q24H  . furosemide  80 mg Intravenous TID  . loratadine  10 mg Oral Daily  . montelukast  10 mg Oral QHS  . oseltamivir  75 mg Oral BID   Continuous Infusions:   LOS: 4 days   Time spent: 40 minutes     WOODS, Geraldo Docker, MD [facility] Pager [phone]  If 7PM-7AM, please contact night-coverage [email or web address] Password [facility] 09/23/2018, 8:03 AM

## 2018-09-23 NOTE — Progress Notes (Signed)
Patient refused CPAP at this time. States he prefers to wear [place]. Informed him that if his SATs dropped during sleep we would need the mask and he said at that time he would try it. RN made aware.

## 2018-09-24 DIAGNOSIS — Z6841 Body Mass Index (BMI) 40.0 and over, adult: Secondary | ICD-10-CM

## 2018-09-24 LAB — BASIC METABOLIC PANEL
Anion gap: 15 (ref 5–15)
BUN: 14 mg/dL (ref 6–20)
CO2: 37 mmol/L — ABNORMAL HIGH (ref 22–32)
Calcium: 9.6 mg/dL (ref 8.9–10.3)
Chloride: 87 mmol/L — ABNORMAL LOW (ref 98–111)
Creatinine, Ser: 1.09 mg/dL (ref 0.61–1.24)
GFR calc Af Amer: >60 mL/min (ref >60)
GFR calc non Af Amer: >60 mL/min (ref >60)
Glucose, Bld: 100 mg/dL — ABNORMAL HIGH (ref 70–99)
Potassium: 4 mmol/L (ref 3.5–5.1)
Sodium: 139 mmol/L (ref 135–145)

## 2018-09-24 LAB — CBC
HCT: 56.2 % — ABNORMAL HIGH (ref 39.0–52.0)
Hemoglobin: 17.1 g/dL — ABNORMAL HIGH (ref 13.0–17.0)
MCH: 25 pg — ABNORMAL LOW (ref 26.0–34.0)
MCHC: 30.4 g/dL (ref 30.0–36.0)
MCV: 82 fL (ref 80.0–100.0)
Platelets: 144 10*3/uL — ABNORMAL LOW (ref 150–400)
RBC: 6.85 MIL/uL — ABNORMAL HIGH (ref 4.22–5.81)
RDW: 18.6 % — ABNORMAL HIGH (ref 11.5–15.5)
WBC: 8 10*3/uL (ref 4.0–10.5)
nRBC: 0 % (ref 0.0–0.2)

## 2018-09-24 LAB — MAGNESIUM: Magnesium: 1.9 mg/dL (ref 1.7–2.4)

## 2018-09-24 MED ORDER — HYDRALAZINE HCL 20 MG/ML IJ SOLN
10.0000 mg | Freq: Once | INTRAMUSCULAR | Status: DC
  Filled 2018-09-24: qty 1

## 2018-09-24 MED ORDER — IPRATROPIUM-ALBUTEROL 0.5-2.5 (3) MG/3ML IN SOLN
3.0000 mL | Freq: Two times a day (BID) | RESPIRATORY_TRACT | Status: DC
  Administered 2018-09-24 – 2018-09-27 (×5): 3 mL via RESPIRATORY_TRACT
  Filled 2018-09-24 (×5): qty 3

## 2018-09-24 NOTE — Plan of Care (Signed)
  Problem: Education: Goal: Ability to verbalize understanding of medication therapies will improve Outcome: Progressing Goal: Individualized Educational Video(s) Outcome: Progressing   Problem: Activity: Goal: Capacity to carry out activities will improve 09/24/2018 1745 by Linton Flemings, RN Outcome: Progressing 09/24/2018 1744 by Linton Flemings, RN Outcome: Progressing   Problem: Cardiac: Goal: Ability to achieve and maintain adequate cardiopulmonary perfusion will improve 09/24/2018 1745 by Linton Flemings, RN Outcome: Progressing 09/24/2018 1744 by Linton Flemings, RN Outcome: Progressing   Problem: Education: Goal: Knowledge of General Education information will improve Description Including pain rating scale, medication(s)/side effects and non-pharmacologic comfort measures Outcome: Progressing   Problem: Clinical Measurements: Goal: Ability to maintain clinical measurements within normal limits will improve 09/24/2018 1745 by Linton Flemings, RN Outcome: Progressing 09/24/2018 1744 by Linton Flemings, RN Outcome: Progressing Goal: Will remain free from infection Outcome: Progressing Goal: Respiratory complications will improve Outcome: Progressing   Problem: Nutrition: Goal: Adequate nutrition will be maintained 09/24/2018 1745 by Linton Flemings, RN Outcome: Progressing 09/24/2018 1744 by Linton Flemings, RN Outcome: Progressing   Problem: Coping: Goal: Level of anxiety will decrease Outcome: Progressing   Problem: Pain Managment: Goal: General experience of comfort will improve Outcome: Progressing   Problem: Safety: Goal: Ability to remain free from injury will improve Outcome: Progressing

## 2018-09-24 NOTE — Progress Notes (Signed)
Nutrition Education Note  RD re-consulted for diet education. RD saw pt on 4/13 in person and provided diet education regarding weight loss and CHF. Please see note from 4/13 for details.  RD called pt today via room phone to discuss any questions pt may have about previous education and to discuss nutrition as it relates to pre-diabetes per new MD consult.  Lab Results  Component Value Date   HGBA1C 6.2 (H) 09/22/2018   RD discussed different food groups and their effects on blood sugar, emphasizing carbohydrate-containing foods. Pt with specific questions regarding desserts. Discussed how the simple carbohydrates in dessert foods cause a  spike in blood sugar and to be aware of recommended portion sizes of these foods.  Discussed importance of controlled and consistent carbohydrate intake throughout the day. Provided examples of ways to balance meals/snacks and encouraged intake of high-fiber, whole grain complex carbohydrates. Teach back method used.  Expect fair compliance.  Body mass index is 70.89 kg/m. Pt meets criteria for obesity class III based on current BMI.  Current diet order is Heart Healthy with 1200 ml fluid restriction, patient is consuming approximately 100% of meals at this time. Labs and medications reviewed. No further nutrition interventions warranted at this time. RD contact information provided. If additional nutrition issues arise, please re-consult RD.   Gaynell Face, MS, RD, LDN Inpatient Clinical Dietitian Pager: [phone] Weekend/After Hours: [phone]

## 2018-09-24 NOTE — Progress Notes (Addendum)
PROGRESS NOTE  Frank Watts DXI:[id number] DOB: August 21, 1980 DOA: 09/19/2018 PCP: No primary care provider on file.  HPI/Recap of past 24 hours: 38 year old male with past medical history of morbid obesity (weighs more than 450 pounds) and hypertension who does not really see a primary care doctor presented to the [facility] emergency room with several weeks of shortness of breath.  Also noted to be hypoxic requiring 6 L nasal cannula.  Initially there was concern about patient having coronavirus, but however his COVID test was negative.  Patient was transferred over to [facility] because of COVID concerns.  However, he was found to be influenza a and B+ as well as be in acute heart failure, no previous history of heart failure.  Patient given course of Tamiflu as well as IV Solu-Medrol.  Initially put on Zithromax and Plaquenil for COVID, but when this was negative, that was discontinued.  Also started on IV Lasix and nightly CPAP.  To date, patient has diuresed over 18 L and his weight is down 16 pounds.  He still requiring significant amount of oxygen, but he feels like his breathing overall has gotten a bit better.  No other complaints.  Assessment/Plan: Principal Problem:   Acute hypoxemic respiratory failure (HCC) secondary to acute diastolic heart failure and influenza plus underlying obstructive sleep apnea from morbid obesity: Continue IV Lasix.  Has completed Tamiflu.  Continue supplemental oxygen and work on weaning off.  Patient meets criteria with BMI significantly greater than 40.[place] echocardiogram done 4/12 notes preserved ejection fraction, but evidence of diastolic dysfunction.  Continue nightly CPAP.  Patient will need close outpatient follow-up with formal sleep study, cardiology and PCP along with following his daily weights.  Discharge once able to wean off of oxygen and for diuresis.  Patient still critically ill requiring high flow oxygen. Active Problems:    Essential hypertension: Blood pressure should improve as patient is better diuresed  Hyperlipidemia: Continue statin.  Prediabetic: A1c at 6.4.  Currently on metformin twice daily.  Counseled patient.  Chronically elevated hemoglobin: Possible polycythemia vera from COPD versus emphysema versus pulmonary fibrosis versus heart disease.  Discussed with hematology who felt because was from his sleep apnea plus recent viral infection.  Recommended rechecking hemoglobin in 30 days and if still elevated, then patient is to follow-up with hematology as outpatient.  Code Status: Full code  Family Communication: Patient declined for me to call family  Disposition Plan: Discharge once fully diuresed and off of oxygen   Consultants:  None  Procedures:  Echocardiogram done 7/67: Diastolic dysfunction  Antimicrobials:  Zithromax x2 days, discontinued on 4/14  DVT prophylaxis: Lovenox   Objective: Vitals:   09/24/18 0835 09/24/18 1006  BP: (!) 152/103   Pulse: 92   Resp: 19   Temp: 97.8 F (36.6 C)   SpO2: 95% 94%    Intake/Output Summary (Last 24 hours) at 09/24/2018 1553 Last data filed at 09/24/2018 1127 Gross per 24 hour  Intake 240 ml  Output 3050 ml  Net -2810 ml   Filed Weights   09/22/18 0333 09/23/18 0300 09/24/18 0615  Weight: (!) 229.6 kg (!) 227.3 kg (!) 224.1 kg   Body mass index is 70.89 kg/m.  Exam:   General: Alert and oriented x3, no acute distress  HEENT: Normocephalic and atraumatic, mucous membrane slightly dry  Neck: Thick, narrow airway  Cardiovascular: Regular rate and rhythm, S1-S2  Respiratory: Decreased breath sounds throughout secondary to body habitus  Abdomen: Soft, obese, nontender, positive  bowel sounds  Musculoskeletal: No clubbing or cyanosis, 1+ pitting edema from the knees down bilaterally  Skin: No skin breaks, tears or lesions  Neuro: No focal deficits  Psychiatry: Appropriate, no evidence of psychoses   Data  Reviewed: CBC: Recent Labs  Lab 09/19/18 1353 09/20/18 1018 09/21/18 0005 09/22/18 0758 09/23/18 0233 09/24/18 0342  WBC 8.8 7.9 8.5 8.3 8.7 8.0  NEUTROABS 6.8 5.7 6.3  --   --   --   HGB 17.4* 16.7 17.7* 17.6* 17.2* 17.1*  HCT 60.1* 57.8* 61.0* 59.3* 57.8* 56.2*  MCV 83.9 86.4 83.6 85.6 83.9 82.0  PLT 203 188 187 185 176 595*   Basic Metabolic Panel: Recent Labs  Lab 09/20/18 0621 09/21/18 0005 09/22/18 0758 09/23/18 0233 09/24/18 0342  NA 145 142 143 140 139  K 4.2 3.9 4.0 5.6* 4.0  CL 94* 89* 85* 85* 87*  CO2 37* 36* 46* 42* 37*  GLUCOSE 99 88 122* 96 100*  BUN _0 CREATININE 1.13 1.12 1.21 1.25* 1.09  CALCIUM 9.2 9.6 9.7 9.1 9.6  MG  --   --  2.1 2.1 1.9   GFR: Estimated Creatinine Clearance: 171.7 mL/min (by C-G formula based on SCr of 1.09 mg/dL). Liver Function Tests: Recent Labs  Lab 09/19/18 1353 09/20/18 0621 09/21/18 0005  AST _1 ALT 34 30 31  ALKPHOS 61 56 61  BILITOT 0.9 1.2 1.1  PROT 6.0* 5.7* 6.4*  ALBUMIN 3.4* 3.1* 3.6   No results for input(s): LIPASE, AMYLASE in the last 168 hours. No results for input(s): AMMONIA in the last 168 hours. Coagulation Profile: No results for input(s): INR, PROTIME in the last 168 hours. Cardiac Enzymes: Recent Labs  Lab 09/20/18 1414 09/21/18 0005 09/21/18 0802  CKTOTAL 136  --   --   TROPONINI 0.03* <0.03 0.03*   BNP (last 3 results) No results for input(s): PROBNP in the last 8760 hours. HbA1C: Recent Labs    09/22/18 0758  HGBA1C 6.2*   CBG: No results for input(s): GLUCAP in the last 168 hours. Lipid Profile: Recent Labs    09/22/18 0758  CHOL 144  HDL 34*  LDLCALC 95  TRIG 76  CHOLHDL 4.2   Thyroid Function Tests: No results for input(s): TSH, T4TOTAL, FREET4, T3FREE, THYROIDAB in the last 72 hours. Anemia Panel: No results for input(s): VITAMINB12, FOLATE, FERRITIN, TIBC, IRON, RETICCTPCT in the last 72 hours. Urine analysis: No results found for:  COLORURINE, APPEARANCEUR, LABSPEC, PHURINE, GLUCOSEU, HGBUR, BILIRUBINUR, KETONESUR, PROTEINUR, UROBILINOGEN, NITRITE, LEUKOCYTESUR Sepsis Labs: _2 (procalcitonin:4,lacticidven:4)  )No results found for this or any previous visit (from the past 240 hour(s)).    Studies: No results found.  Scheduled Meds: . aspirin EC  81 mg Oral Daily  . atorvastatin  20 mg Oral q1800  . enoxaparin (LOVENOX) injection  115 mg Subcutaneous Q24H  . furosemide  80 mg Intravenous TID  . hydrALAZINE  10 mg Intravenous Once  . ipratropium-albuterol  3 mL Nebulization BID  . loratadine  10 mg Oral Daily  . metFORMIN  500 mg Oral BID WC  . methylPREDNISolone (SOLU-MEDROL) injection  60 mg Intravenous Q24H  . montelukast  10 mg Oral QHS    Continuous Infusions:   LOS: 5 days   Time spent: I have spent 40 minutes in the care of this critically ill patient including medical decision making, bedside examination, review of his records and interpretation of current labs and treatments.  Annita Brod,  MD [facility]  To reach me or the doctor on call, go to: [email or web address] Password [facility]  09/24/2018, 3:53 PM

## 2018-09-24 NOTE — Progress Notes (Signed)
Pt stated he could place self on cpap when ready for bed.

## 2018-09-24 NOTE — Plan of Care (Signed)
  Problem: Education: Goal: Individualized Educational Video(s) Outcome: Progressing   Problem: Activity: Goal: Capacity to carry out activities will improve Outcome: Progressing   Problem: Cardiac: Goal: Ability to achieve and maintain adequate cardiopulmonary perfusion will improve Outcome: Progressing   Problem: Clinical Measurements: Goal: Ability to maintain clinical measurements within normal limits will improve Outcome: Progressing Goal: Will remain free from infection Outcome: Progressing   Problem: Nutrition: Goal: Adequate nutrition will be maintained Outcome: Progressing   Problem: Coping: Goal: Level of anxiety will decrease Outcome: Progressing

## 2018-09-25 DIAGNOSIS — D751 Secondary polycythemia: Secondary | ICD-10-CM

## 2018-09-25 DIAGNOSIS — G4733 Obstructive sleep apnea (adult) (pediatric): Secondary | ICD-10-CM

## 2018-09-25 HISTORY — DX: Secondary polycythemia: D75.1

## 2018-09-25 HISTORY — DX: Obstructive sleep apnea (adult) (pediatric): G47.33

## 2018-09-25 LAB — BASIC METABOLIC PANEL
Anion gap: 12 (ref 5–15)
BUN: 16 mg/dL (ref 6–20)
CO2: 40 mmol/L — ABNORMAL HIGH (ref 22–32)
Calcium: 9.5 mg/dL (ref 8.9–10.3)
Chloride: 88 mmol/L — ABNORMAL LOW (ref 98–111)
Creatinine, Ser: 1.1 mg/dL (ref 0.61–1.24)
GFR calc Af Amer: >60 mL/min (ref >60)
GFR calc non Af Amer: >60 mL/min (ref >60)
Glucose, Bld: 103 mg/dL — ABNORMAL HIGH (ref 70–99)
Potassium: 3.6 mmol/L (ref 3.5–5.1)
Sodium: 140 mmol/L (ref 135–145)

## 2018-09-25 LAB — CBC
HCT: 57.5 % — ABNORMAL HIGH (ref 39.0–52.0)
Hemoglobin: 17 g/dL (ref 13.0–17.0)
MCH: 24.5 pg — ABNORMAL LOW (ref 26.0–34.0)
MCHC: 29.6 g/dL — ABNORMAL LOW (ref 30.0–36.0)
MCV: 82.9 fL (ref 80.0–100.0)
Platelets: 196 10*3/uL (ref 150–400)
RBC: 6.94 MIL/uL — ABNORMAL HIGH (ref 4.22–5.81)
RDW: 18.8 % — ABNORMAL HIGH (ref 11.5–15.5)
WBC: 10 10*3/uL (ref 4.0–10.5)
nRBC: 0 % (ref 0.0–0.2)

## 2018-09-25 LAB — MAGNESIUM: Magnesium: 2 mg/dL (ref 1.7–2.4)

## 2018-09-25 MED ORDER — POTASSIUM CHLORIDE CRYS ER 20 MEQ PO TBCR
40.0000 meq | EXTENDED_RELEASE_TABLET | Freq: Once | ORAL | Status: AC
  Administered 2018-09-25: 07:00:00 40 meq via ORAL
  Filled 2018-09-25: qty 2

## 2018-09-25 MED ORDER — TORSEMIDE 20 MG PO TABS
60.0000 mg | ORAL_TABLET | Freq: Two times a day (BID) | ORAL | Status: DC
  Administered 2018-09-25 – 2018-09-26 (×4): 60 mg via ORAL
  Filled 2018-09-25 (×4): qty 3

## 2018-09-25 MED ORDER — SPIRONOLACTONE 12.5 MG HALF TABLET
12.5000 mg | ORAL_TABLET | Freq: Every day | ORAL | Status: DC
  Administered 2018-09-25: 12.5 mg via ORAL
  Filled 2018-09-25: qty 1

## 2018-09-25 MED ORDER — ACETAZOLAMIDE 250 MG PO TABS
250.0000 mg | ORAL_TABLET | Freq: Two times a day (BID) | ORAL | Status: DC
  Administered 2018-09-25 (×2): 250 mg via ORAL
  Filled 2018-09-25 (×3): qty 1

## 2018-09-25 NOTE — Progress Notes (Signed)
PROGRESS NOTE  Frank Watts TWS:[id number] DOB: 1980/07/11 DOA: 09/19/2018 PCP: No primary care provider on file.   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  Also noted to be hypoxic requiring 6 L nasal cannula.  Initially there was concern about patient having coronavirus, but however his COVID test was negative.  Patient was transferred over to [facility] because of COVID concerns.  However, he was found to be influenza A and B+ as well as be in acute diastolic heart failure, no previous history of heart failure.  Patient given course of Tamiflu as well as IV Solu-Medrol.  Initially put on Zithromax and Plaquenil for COVID, but when this was negative, that was discontinued.  Also started on IV Lasix and nightly CPAP.  To date, patient has diuresed over 19 L and his weight is down 19 pounds but continued to require high level of oxygen.  He was not on oxygen prior to arrival.  Subjective: No major events overnight of this morning.  He has no complaint.  Remains on 3 L by high flow nasal cannula.  Oxygen fluctuates from low 90s to upper 90s.  Denies chest pain.  Reports improvement in his breathing.  Denies nausea or vomiting.  Denies orthopnea.  Assessment & Plan: Principal Problem:   Acute hypoxemic respiratory failure (HCC) Active Problems:   Acute CHF (congestive heart failure) (HCC)   Influenza   Essential hypertension   Morbid obesity   Acute diastolic CHF (congestive heart failure) (HCC)  Acute hypoxemic respiratory failure: Multifactorial including pneumonia diastolic CHF exacerbation, influenza, OSA/OHS and possible COPD.  Continues to require high level of oxygen by HF [place].  -Wean oxygen as able. -Manage comorbidities as  below.  Influenza A and B: Completed course of Tamiflu.  New diastolic CHF: On 0/06/7492 with EF greater than 65%, mod concentric LVH, diastolic dysfunction but no other significant structural or function abnormalities.  He needs to require high level of oxygen by HF [place] despite good diuresis with IV Lasix.  Fluid status difficult to assess due to body habitus -We will transition to torsemide 60 mg twice daily -Add low-dose Spironolactone to spare potassium -We will give 3 doses of Diamox for contraction alkalosis -Daily weight, intake output and renal function -Outpatient cardiology follow-up at discharge -Wean oxygen as able.  Contraction alkalosis: Likely due to diuretics as above -Diamox as above.  OSA/OHS/possible COPD-has no formal sleep study. -Continue CPAP nightly -Need outpatient sleep evaluation. -Finishing steroid course 4/17.  Essential hypertension: blood pressure fairly controlled -Continue diuretics as above  Prediabetes: A1c 6.4%.  Polycythemia: Likely due to OSA/OHS/possible COPD as above.  Has not thrombocytosis so unlikely polycythemia vera. Previous provider discussed with hematology who recommended rechecking hemoglobin in 30 days and outpatient follow-up with hematology if remains elevated. -Continue CPAP -Outpatient sleep study as above  Morbid obesity: BMI 70.54 -Needs lifestyle change to lose weight -May consider bariatric surgery as an outpatient.   Scheduled Meds: . acetaZOLAMIDE  250 mg Oral BID  . aspirin EC  81 mg Oral Daily  . atorvastatin  20 mg Oral q1800  . enoxaparin (LOVENOX) injection  115 mg Subcutaneous Q24H  . hydrALAZINE  10 mg Intravenous Once  . ipratropium-albuterol  3 mL Nebulization BID  . loratadine  10 mg Oral Daily  . metFORMIN  500 mg Oral BID WC  . montelukast  10 mg Oral QHS  . spironolactone  12.5 mg Oral Daily  . torsemide  60 mg Oral BID   Continuous Infusions: PRN Meds:.acetaminophen, ondansetron **OR**  ondansetron (ZOFRAN) IV, sodium chloride, sodium chloride flush  DVT prophylaxis: Subcu Lovenox Code Status: Full code Family Communication: None at bedside Disposition Plan: Remains inpatient.  Continues to require high level of oxygen via HF [place]  Consultants:   Hematology over the phone previous provider  Procedures:   None  Antimicrobials:  Zithromax x2 days, discontinued on 4/14  Tamiflu for 5 days.  Objective: Vitals:   09/25/18 0400 09/25/18 0620 09/25/18 0939 09/25/18 0941  BP:   127/65   Pulse: 82  83   Resp: 17  18   Temp:   98.4 F (36.9 C)   TempSrc:   Oral   SpO2: 96%  95% 94%  Weight:  (!) 223 kg    Height:        Intake/Output Summary (Last 24 hours) at 09/25/2018 1017 Last data filed at 09/25/2018 0940 Gross per 24 hour  Intake 1872 ml  Output 3100 ml  Net -1228 ml   Filed Weights   09/23/18 0300 09/24/18 0615 09/25/18 0620  Weight: (!) 227.3 kg (!) 224.1 kg (!) 223 kg    Examination:  GENERAL: Appears well. No acute distress.  EYES - vision grossly intact. Sclera anicteric.  NOSE- no gross deformity or drainage MOUTH - no oral lesions noted THROAT- no swelling or erythema LUNGS:   On 10 L by HF [place].  Diminished air movement bilaterally.  No crackles or wheeze but difficult exam due to body habitus. HEART:   RRR. Heart sounds normal.  Difficult to assess JVD.  Has significant venous insufficiency bilaterally ABD: Bowel sounds present. Soft. Non tender.  Limited exam due to body habitus. MSK/EXT: Moves extremities. No obvious deformity. SKIN: no apparent skin lesion.  NEURO: Awake, alert and oriented appropriately.  No gross deficit.  PSYCH: Calm. Normal affect.   Data Reviewed: I have independently reviewed following labs and imaging studies  CBC: Recent Labs  Lab 09/19/18 1353 09/20/18 1018 09/21/18 0005 09/22/18 0758 09/23/18 0233 09/24/18 0342 09/25/18 0230  WBC 8.8 7.9 8.5 8.3 8.7 8.0 10.0  NEUTROABS 6.8 5.7 6.3  --   --   --    --   HGB 17.4* 16.7 17.7* 17.6* 17.2* 17.1* 17.0  HCT 60.1* 57.8* 61.0* 59.3* 57.8* 56.2* 57.5*  MCV 83.9 86.4 83.6 85.6 83.9 82.0 82.9  PLT 203 188 187 185 176 144* 935   Basic Metabolic Panel: Recent Labs  Lab 09/21/18 0005 09/22/18 0758 09/23/18 0233 09/24/18 0342 09/25/18 0230  NA 142 143 140 139 140  K 3.9 4.0 5.6* 4.0 3.6  CL 89* 85* 85* 87* 88*  CO2 36* 46* 42* 37* 40*  GLUCOSE 88 122* 96 100* 103*  BUN _0 CREATININE 1.12 1.21 1.25* 1.09 1.10  CALCIUM 9.6 9.7 9.1 9.6 9.5  MG  --  2.1 2.1 1.9 2.0   GFR: Estimated Creatinine Clearance: 169.6 mL/min (by C-G formula based on SCr of 1.1 mg/dL). Liver Function Tests: Recent Labs  Lab 09/19/18 1353 09/20/18 0621 09/21/18 0005  AST _1 ALT 34 30 31  ALKPHOS 61 56 61  BILITOT 0.9 1.2 1.1  PROT 6.0* 5.7* 6.4*  ALBUMIN 3.4* 3.1* 3.6   No results  for input(s): LIPASE, AMYLASE in the last 168 hours. No results for input(s): AMMONIA in the last 168 hours. Coagulation Profile: No results for input(s): INR, PROTIME in the last 168 hours. Cardiac Enzymes: Recent Labs  Lab 09/20/18 1414 09/21/18 0005 09/21/18 0802  CKTOTAL 136  --   --   TROPONINI 0.03* <0.03 0.03*   BNP (last 3 results) No results for input(s): PROBNP in the last 8760 hours. HbA1C: No results for input(s): HGBA1C in the last 72 hours. CBG: No results for input(s): GLUCAP in the last 168 hours. Lipid Profile: No results for input(s): CHOL, HDL, LDLCALC, TRIG, CHOLHDL, LDLDIRECT in the last 72 hours. Thyroid Function Tests: No results for input(s): TSH, T4TOTAL, FREET4, T3FREE, THYROIDAB in the last 72 hours. Anemia Panel: No results for input(s): VITAMINB12, FOLATE, FERRITIN, TIBC, IRON, RETICCTPCT in the last 72 hours. Urine analysis: No results found for: COLORURINE, APPEARANCEUR, LABSPEC, PHURINE, GLUCOSEU, HGBUR, BILIRUBINUR, KETONESUR, PROTEINUR, UROBILINOGEN, NITRITE, LEUKOCYTESUR Sepsis Labs: Invalid input(s):  PROCALCITONIN, LACTICIDVEN  No results found for this or any previous visit (from the past 240 hour(s)).    Radiology Studies: No results found.    Ellison Leisure T[facility] Pager [phone]  If 7PM-7AM, please contact night-coverage [email or web address] Password [email or web address] 09/25/2018, 10:17 AM

## 2018-09-25 NOTE — Progress Notes (Signed)
Patient places self on CPAP.  RT hooked CPAP to oxygen and added sterile water.

## 2018-09-26 LAB — BASIC METABOLIC PANEL
Anion gap: 8 (ref 5–15)
BUN: 17 mg/dL (ref 6–20)
CO2: 39 mmol/L — ABNORMAL HIGH (ref 22–32)
Calcium: 9.8 mg/dL (ref 8.9–10.3)
Chloride: 91 mmol/L — ABNORMAL LOW (ref 98–111)
Creatinine, Ser: 1.11 mg/dL (ref 0.61–1.24)
GFR calc Af Amer: >60 mL/min (ref >60)
GFR calc non Af Amer: >60 mL/min (ref >60)
Glucose, Bld: 108 mg/dL — ABNORMAL HIGH (ref 70–99)
Potassium: 3.6 mmol/L (ref 3.5–5.1)
Sodium: 138 mmol/L (ref 135–145)

## 2018-09-26 LAB — CBC
HCT: 57.4 % — ABNORMAL HIGH (ref 39.0–52.0)
Hemoglobin: 17.2 g/dL — ABNORMAL HIGH (ref 13.0–17.0)
MCH: 24.6 pg — ABNORMAL LOW (ref 26.0–34.0)
MCHC: 30 g/dL (ref 30.0–36.0)
MCV: 82.1 fL (ref 80.0–100.0)
Platelets: 188 10*3/uL (ref 150–400)
RBC: 6.99 MIL/uL — ABNORMAL HIGH (ref 4.22–5.81)
RDW: 19 % — ABNORMAL HIGH (ref 11.5–15.5)
WBC: 9 10*3/uL (ref 4.0–10.5)
nRBC: 0 % (ref 0.0–0.2)

## 2018-09-26 LAB — MAGNESIUM: Magnesium: 2 mg/dL (ref 1.7–2.4)

## 2018-09-26 LAB — GLUCOSE, CAPILLARY: Glucose-Capillary: 77 mg/dL (ref 70–99)

## 2018-09-26 MED ORDER — POTASSIUM CHLORIDE CRYS ER 20 MEQ PO TBCR
20.0000 meq | EXTENDED_RELEASE_TABLET | Freq: Once | ORAL | Status: AC
  Administered 2018-09-26: 15:00:00 20 meq via ORAL
  Filled 2018-09-26: qty 1

## 2018-09-26 MED ORDER — ENOXAPARIN SODIUM 120 MG/0.8ML ~~LOC~~ SOLN
110.0000 mg | SUBCUTANEOUS | Status: DC
  Administered 2018-09-26: 110 mg via SUBCUTANEOUS
  Filled 2018-09-26 (×2): qty 0.73

## 2018-09-26 MED ORDER — ACETAZOLAMIDE 250 MG PO TABS
250.0000 mg | ORAL_TABLET | Freq: Three times a day (TID) | ORAL | Status: DC
  Administered 2018-09-26 (×3): 250 mg via ORAL
  Filled 2018-09-26 (×5): qty 1

## 2018-09-26 MED ORDER — SPIRONOLACTONE 25 MG PO TABS
25.0000 mg | ORAL_TABLET | Freq: Every day | ORAL | Status: DC
  Administered 2018-09-26: 11:00:00 25 mg via ORAL
  Filled 2018-09-26: qty 1

## 2018-09-26 NOTE — Plan of Care (Signed)
  Problem: Education: Goal: Knowledge of General Education information will improve Description Including pain rating scale, medication(s)/side effects and non-pharmacologic comfort measures Outcome: Progressing Note:  POC reviewed with pt.   Problem: Clinical Measurements: Goal: Respiratory complications will improve Outcome: Progressing

## 2018-09-26 NOTE — Progress Notes (Signed)
PROGRESS NOTE  Frank Watts YQM:[id number] DOB: [birth year] DOA: 09/19/2018 PCP: No primary care provider on file.   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  Also noted to be hypoxic requiring 6 L nasal cannula.  Initially there was concern about patient having coronavirus, but however his COVID test was negative.  Patient was transferred over to [facility] because of COVID concerns.  However, he was found to be influenza A and B+ as well as be in acute diastolic heart failure, no previous history of heart failure.  Patient given course of Tamiflu as well as IV Solu-Medrol.  Initially put on Zithromax and Plaquenil for COVID, but when this was negative, that was discontinued.  Patient was aggressively diuresed with IV Lasix with improvement in his breathing.  Is also started on CPAP for OSA/OHS.  Transition to oral torsemide and spironolactone on 4/16 continues to diurese well.  Slowly weaning oxygen.   Subjective: No major events overnight of this morning.  He has no complaint.  Excellent response to torsemide and spironolactone.  Still on 4 L by nasal cannula.  Denies chest pain, dyspnea or other complaints.   Assessment & Plan: Principal Problem:   Acute hypoxemic respiratory failure (HCC) Active Problems:   Influenza   Essential hypertension   Morbid obesity   Acute diastolic CHF (congestive heart failure) (HCC)   OSA (obstructive sleep apnea)   Polycythemia  Acute hypoxemic respiratory failure: Multifactorial including pneumonia diastolic CHF exacerbation, influenza, OSA/OHS and possible COPD.  Continues to require high level of oxygen by HF [place].  -Wean oxygen as able. -Manage comorbidities as below.  Influenza A and B:  Completed course of Tamiflu.  New diastolic CHF: On 11/06/4130 with EF greater than 65%, mod concentric LVH, diastolic dysfunction but no other significant structural or function abnormalities.  He needs to require high level of oxygen by HF [place] despite good diuresis with IV Lasix.  Fluid status difficult to assess due to body habitus but responding well to oral torsemide and spironolactone.  Renal function stable. -Continue torsemide 60 mg twice daily 4/16-- -Increase spironolactone to 25 mg daily to spare potassium -Diamox 250 mg 3 times daily for contraction alkalosis -Daily weight, intake output and renal function -Outpatient cardiology follow-up at discharge -Wean oxygen as able. -We will ambulate with or without oxygen tomorrow  Contraction alkalosis: Likely due to diuretics as above -Diamox as above.  OSA/OHS/possible COPD-has no formal sleep study. -Continue CPAP nightly -Need outpatient sleep evaluation. -Finished burst steroid course on 4/17 -Continue DuoNeb and Singulair  Essential hypertension: blood pressure fairly controlled -Continue diuretics as above  Prediabetes: A1c 6.4%. -Continue metformin -Continue statin  Polycythemia: Likely due to OSA/OHS/possible COPD as above.  Has not thrombocytosis so unlikely polycythemia vera. Previous provider discussed with hematology who recommended rechecking hemoglobin in 30 days and outpatient follow-up with hematology if remains elevated. -Continue CPAP -Outpatient sleep study as above  Morbid obesity: BMI 70.54 -Needs lifestyle change to lose weight -May consider bariatric surgery as an outpatient.   Scheduled Meds: . acetaZOLAMIDE  250 mg Oral TID  . aspirin EC  81 mg Oral Daily  . atorvastatin  20 mg Oral q1800  . enoxaparin (LOVENOX) injection  110 mg Subcutaneous Q24H  . hydrALAZINE  10 mg Intravenous Once  . ipratropium-albuterol  3 mL Nebulization BID  . loratadine  10 mg Oral Daily  . metFORMIN  500 mg Oral BID  WC  . montelukast  10 mg Oral QHS  . potassium chloride  20 mEq Oral Once  . spironolactone  25 mg Oral Daily  . torsemide  60 mg Oral BID   Continuous Infusions: PRN Meds:.acetaminophen, ondansetron **OR** ondansetron (ZOFRAN) IV, sodium chloride, sodium chloride flush  DVT prophylaxis: Subcu Lovenox Code Status: Full code Family Communication: None at bedside Disposition Plan: Remains inpatient.  Continues to require high level of oxygen.  Will ambulate with or without oxygen in the next 24-hour  Consultants:   Hematology over the phone previous provider  Procedures:   None  Antimicrobials:  Zithromax x2 days, discontinued on 4/14  Tamiflu for 5 days.  Objective: Vitals:   09/25/18 2347 09/26/18 0544 09/26/18 0746 09/26/18 1111  BP: 116/74 (!) 141/80 (!) 115/57 109/71  Pulse: 85  85 82  Resp: 18 (!) _0 Temp: 97.7 F (36.5 C) 98.1 F (36.7 C) 97.8 F (36.6 C) 98.1 F (36.7 C)  TempSrc: Oral Oral Oral Oral  SpO2: 98% 97% 94% 91%  Weight:  (!) 221.8 kg    Height:        Intake/Output Summary (Last 24 hours) at 09/26/2018 1127 Last data filed at 09/26/2018 1123 Gross per 24 hour  Intake 480 ml  Output 4800 ml  Net -4320 ml   Filed Weights   09/24/18 0615 09/25/18 0620 09/26/18 0544  Weight: (!) 224.1 kg (!) 223 kg (!) 221.8 kg    Examination: GENERAL: Appears well. No acute distress.  HEENT: MMM.  Vision and Hearing grossly intact.  NECK: Supple.  Difficult to assess JVD due to body habitus LUNGS: On 4 L by [place].  Diminished air movement bilaterally.  No crackles or wheeze but difficult exam due to body habitus. HEART:  RRR. Heart sounds normal.  Difficult to assess JVD due to body habitus.  Significant venous insufficiency bilaterally ABD: Bowel sounds present. Soft. Non tender.  Limited exam due to body habitus. EXT: Venous insufficiencies and some edema bilaterally SKIN: no apparent skin lesion.  Venous insufficiency in both lower extremities  NEURO: Awake, alert and oriented appropriately.  No gross deficit.  PSYCH: Calm. Normal affect.  Data Reviewed: I have independently reviewed following labs and imaging studies  CBC: Recent Labs  Lab 09/19/18 1353 09/20/18 1018 09/21/18 0005 09/22/18 0758 09/23/18 0233 09/24/18 0342 09/25/18 0230 09/26/18 0215  WBC 8.8 7.9 8.5 8.3 8.7 8.0 10.0 9.0  NEUTROABS 6.8 5.7 6.3  --   --   --   --   --   HGB 17.4* 16.7 17.7* 17.6* 17.2* 17.1* 17.0 17.2*  HCT 60.1* 57.8* 61.0* 59.3* 57.8* 56.2* 57.5* 57.4*  MCV 83.9 86.4 83.6 85.6 83.9 82.0 82.9 82.1  PLT 203 188 187 185 176 144* 196 689   Basic Metabolic Panel: Recent Labs  Lab 09/22/18 0758 09/23/18 0233 09/24/18 0342 09/25/18 0230 09/26/18 0215  NA 143 140 139 140 138  K 4.0 5.6* 4.0 3.6 3.6  CL 85* 85* 87* 88* 91*  CO2 46* 42* 37* 40* 39*  GLUCOSE 122* 96 100* 103* 108*  BUN _1 CREATININE 1.21 1.25* 1.09 1.10 1.11  CALCIUM 9.7 9.1 9.6 9.5 9.8  MG 2.1 2.1 1.9 2.0 2.0   GFR: Estimated Creatinine Clearance: 167.4 mL/min (by C-G formula based  on SCr of 1.11 mg/dL). Liver Function Tests: Recent Labs  Lab 09/19/18 1353 09/20/18 0621 09/21/18 0005  AST _0 ALT 34 30 31  ALKPHOS 61 56 61  BILITOT 0.9 1.2 1.1  PROT 6.0* 5.7* 6.4*  ALBUMIN 3.4* 3.1* 3.6   No results for input(s): LIPASE, AMYLASE in the last 168 hours. No results for input(s): AMMONIA in the last 168 hours. Coagulation Profile: No results for input(s): INR, PROTIME in the last 168 hours. Cardiac Enzymes: Recent Labs  Lab 09/20/18 1414 09/21/18 0005 09/21/18 0802  CKTOTAL 136  --   --   TROPONINI 0.03* <0.03 0.03*   BNP (last 3 results) No results for input(s): PROBNP in the last 8760 hours. HbA1C: No results for input(s): HGBA1C in the last 72 hours. CBG: Recent Labs  Lab 09/26/18 0650  GLUCAP 77   Lipid Profile: No results for input(s): CHOL, HDL, LDLCALC, TRIG, CHOLHDL, LDLDIRECT in the last 72 hours. Thyroid  Function Tests: No results for input(s): TSH, T4TOTAL, FREET4, T3FREE, THYROIDAB in the last 72 hours. Anemia Panel: No results for input(s): VITAMINB12, FOLATE, FERRITIN, TIBC, IRON, RETICCTPCT in the last 72 hours. Urine analysis: No results found for: COLORURINE, APPEARANCEUR, LABSPEC, PHURINE, GLUCOSEU, HGBUR, BILIRUBINUR, KETONESUR, PROTEINUR, UROBILINOGEN, NITRITE, LEUKOCYTESUR Sepsis Labs: Invalid input(s): PROCALCITONIN, LACTICIDVEN  No results found for this or any previous visit (from the past 240 hour(s)).    Radiology Studies: No results found.   Sharryn Belding T. [facility] Pager [phone]  If 7PM-7AM, please contact night-coverage [email or web address] Password [facility] 09/26/2018, 11:27 AM

## 2018-09-26 NOTE — Progress Notes (Signed)
Pt places himself on CPAP. RT checked O2 and sterile water reservoir. RT will continue to monitor.

## 2018-09-27 LAB — BASIC METABOLIC PANEL
Anion gap: 12 (ref 5–15)
BUN: 21 mg/dL — ABNORMAL HIGH (ref 6–20)
CO2: 34 mmol/L — ABNORMAL HIGH (ref 22–32)
Calcium: 9.6 mg/dL (ref 8.9–10.3)
Chloride: 92 mmol/L — ABNORMAL LOW (ref 98–111)
Creatinine, Ser: 1.3 mg/dL — ABNORMAL HIGH (ref 0.61–1.24)
GFR calc Af Amer: >60 mL/min (ref >60)
GFR calc non Af Amer: >60 mL/min (ref >60)
Glucose, Bld: 93 mg/dL (ref 70–99)
Potassium: 3.1 mmol/L — ABNORMAL LOW (ref 3.5–5.1)
Sodium: 138 mmol/L (ref 135–145)

## 2018-09-27 LAB — CBC
HCT: 56.6 % — ABNORMAL HIGH (ref 39.0–52.0)
Hemoglobin: 16.7 g/dL (ref 13.0–17.0)
MCH: 24.2 pg — ABNORMAL LOW (ref 26.0–34.0)
MCHC: 29.5 g/dL — ABNORMAL LOW (ref 30.0–36.0)
MCV: 82 fL (ref 80.0–100.0)
Platelets: 162 10*3/uL (ref 150–400)
RBC: 6.9 MIL/uL — ABNORMAL HIGH (ref 4.22–5.81)
RDW: 19 % — ABNORMAL HIGH (ref 11.5–15.5)
WBC: 8.2 10*3/uL (ref 4.0–10.5)
nRBC: 0 % (ref 0.0–0.2)

## 2018-09-27 LAB — MAGNESIUM: Magnesium: 2.1 mg/dL (ref 1.7–2.4)

## 2018-09-27 MED ORDER — TORSEMIDE 20 MG PO TABS: 40.0000 mg | ORAL_TABLET | Freq: Every day | ORAL | 0 refills | Status: DC

## 2018-09-27 MED ORDER — MONTELUKAST SODIUM 10 MG PO TABS: 10.0000 mg | ORAL_TABLET | Freq: Every day | ORAL | 0 refills | Status: DC

## 2018-09-27 MED ORDER — SPIRONOLACTONE 12.5 MG HALF TABLET
12.5000 mg | ORAL_TABLET | Freq: Every day | ORAL | Status: DC
  Administered 2018-09-27: 12.5 mg via ORAL
  Filled 2018-09-27: qty 1

## 2018-09-27 MED ORDER — ATORVASTATIN CALCIUM 20 MG PO TABS: 20.0000 mg | ORAL_TABLET | Freq: Every day | ORAL | 0 refills | Status: DC

## 2018-09-27 MED ORDER — POTASSIUM CHLORIDE CRYS ER 20 MEQ PO TBCR
40.0000 meq | EXTENDED_RELEASE_TABLET | ORAL | Status: AC
  Administered 2018-09-27 (×2): 40 meq via ORAL
  Filled 2018-09-27 (×2): qty 2

## 2018-09-27 MED ORDER — METFORMIN HCL 500 MG PO TABS: 500.0000 mg | ORAL_TABLET | Freq: Two times a day (BID) | ORAL | 0 refills | Status: DC

## 2018-09-27 MED ORDER — SPIRONOLACTONE 25 MG PO TABS: 12.5000 mg | ORAL_TABLET | Freq: Every day | ORAL | 0 refills | Status: DC

## 2018-09-27 MED ORDER — TORSEMIDE 20 MG PO TABS
40.0000 mg | ORAL_TABLET | Freq: Two times a day (BID) | ORAL | Status: DC
  Administered 2018-09-27: 08:00:00 40 mg via ORAL
  Filled 2018-09-27: qty 2

## 2018-09-27 NOTE — Progress Notes (Signed)
Pt got discharged to home, discharge instructions provided and patient showed understanding to it, IV taken out,Telemonitor DC,pt left unit in wheelchair with all of the belongings accompanied with a friend.  Palma Holter, RN

## 2018-09-27 NOTE — Plan of Care (Signed)
  Problem: Education: Goal: Ability to verbalize understanding of medication therapies will improve Outcome: Progressing Goal: Individualized Educational Video(s) Outcome: Progressing   Problem: Activity: Goal: Capacity to carry out activities will improve Outcome: Progressing   Problem: Cardiac: Goal: Ability to achieve and maintain adequate cardiopulmonary perfusion will improve Outcome: Progressing   Problem: Education: Goal: Knowledge of General Education information will improve Description Including pain rating scale, medication(s)/side effects and non-pharmacologic comfort measures Outcome: Progressing   Problem: Health Behavior/Discharge Planning: Goal: Ability to manage health-related needs will improve Outcome: Progressing   Problem: Clinical Measurements: Goal: Ability to maintain clinical measurements within normal limits will improve Outcome: Progressing Goal: Will remain free from infection Outcome: Progressing Goal: Diagnostic test results will improve Outcome: Progressing Goal: Respiratory complications will improve Outcome: Progressing Goal: Cardiovascular complication will be avoided Outcome: Progressing   Problem: Activity: Goal: Risk for activity intolerance will decrease Outcome: Progressing   Problem: Nutrition: Goal: Adequate nutrition will be maintained Outcome: Progressing   Problem: Coping: Goal: Level of anxiety will decrease Outcome: Progressing   Problem: Elimination: Goal: Will not experience complications related to bowel motility Outcome: Progressing Goal: Will not experience complications related to urinary retention Outcome: Progressing   Problem: Pain Managment: Goal: General experience of comfort will improve Outcome: Progressing   Problem: Safety: Goal: Ability to remain free from injury will improve Outcome: Progressing   Problem: Skin Integrity: Goal: Risk for impaired skin integrity will decrease Outcome:  Progressing

## 2018-09-27 NOTE — Discharge Summary (Signed)
Physician Discharge Summary  Teshawn Moan LPF:[id number] DOB: 02/19/1981 DOA: 09/19/2018  PCP: No primary care provider on file.  Admit date: 09/19/2018 Discharge date: 09/27/2018  Admitted From: Home Disposition: Home  Recommendations for Outpatient Follow-up:  1. Follow up with PCP in 1 weeks 2. Please obtain BMP/CBC in one week 3. Recommend referral to cardiology and sleep clinic 4. 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 Also noted to be hypoxic requiring 6 L nasal cannula. Initially there was concern about patient having coronavirus, but however his COVID test was negative. Patient was transferred over to [facility] because of COVID concerns. However, he was found to be influenza A and B+ as well as be in acute diastolic heart failure, no previous history of heart failure.  Patient given course of Tamiflu as well as IV Solu-Medrol. Initially put on Zithromax and Plaquenil for COVID, but when this was negative, that was discontinued.  Patient was aggressively diuresed with IV Lasix with improvement in his breathing.  Is also started on CPAP for OSA/OHS.  Transitioned to oral torsemide 60 mg twice daily and spironolactone on 4/16 continues to diurese well.    On the day of discharge, patient was completely weaned off oxygen.  Ambulated in the hallway on room air and maintained oxygen saturation of 94% without distress.  He was so excited and ready to go home. Of note, his creatinine slightly trended up from 1.1-1.3 although it has been 1.25 on 4/14.  Torsemide reduced to 40 mg daily.  He will be  discharged on Aldactone 12.5 mg to spare potassium.   Patient has no PCP.  Case manager, consulted and will arrange earlier follow up for next week on Monday.   Patient was given a work Quarry manager.   See individual problem list below for more  Discharge Diagnoses:  Principal Problem:   Acute hypoxemic respiratory failure ([place]) Active Problems:   Influenza   Essential hypertension   Morbid obesity   Acute diastolic CHF (congestive heart failure) (HCC)   OSA (obstructive sleep apnea)   Polycythemia  Acute hypoxemic respiratory failure: Multifactorial including pneumonia diastolic CHF exacerbation, influenza, OSA/OHS and possible COPD.    Resolved.  Ambulated on room air maintain saturation greater than 94%.  He is a still at risk of nocturnal desaturation due to OSA/OHS.  No need outpatient sleep study. -Please arrange outpatient sleep study as soon as possible.  Influenza A and B: Completed course of Tamiflu.  New diastolic CHF: On 5/32/9924 with EF greater than 65%, mod concentric LVH, diastolic dysfunction but no other significant structural or function abnormalities.    Aggressively diuresed with IV Lasix.  Transitioned to oral torsemide  and continues to diurese well.  -Discharge on torsemide and spironolactone as below -Recommended salt and fluid restriction -Discussed return precautions. -Need BMP in 1 week and cardiology referral as soon as possible.  Contraction alkalosis: Likely due to diuretics as above -Resolved with Diamox.  OSA/OHS/possible COPD-has no formal sleep study. -Finished a steroid course on 4/17 -Discharged on Singulair and pro-air. -Outpatient sleep study as soon as possible.  Essential hypertension: blood pressure fairly controlled -Diuretics as above  Prediabetes: A1c 6.4%. -Continue  metformin -Continue statin  Polycythemia: Likely due to OSA/OHS/possible COPD as above.  Has not thrombocytosis so unlikely polycythemia vera. Previous provider  discussed with hematology who recommended rechecking hemoglobin in 30 days and outpatient follow-up with hematology if remains elevated. -Outpatient sleep study as above  Morbid obesity: BMI 70.54 -Needs lifestyle change to lose weight -May consider bariatric surgery as an outpatient.   Discharge Instructions  Discharge Instructions    (HEART FAILURE PATIENTS) Call MD:  Anytime you have any of the following symptoms: 1) 3 pound weight gain in 24 hours or 5 pounds in 1 week 2) shortness of breath, with or without a dry hacking cough 3) swelling in the hands, feet or stomach 4) if you have to sleep on extra pillows at night in order to breathe.   Complete by:  As directed    AMB referral to CHF clinic   Complete by:  As directed    Call MD for:  difficulty breathing, headache or visual disturbances   Complete by:  As directed    Call MD for:  persistant dizziness or light-headedness   Complete by:  As directed    Call MD for:  persistant nausea and vomiting   Complete by:  As directed    Diet - low sodium heart healthy   Complete by:  As directed    Discharge instructions   Complete by:  As directed    It has been a pleasure taking care of you! -You were admitted with difficulty breathing due to influenza and heart failure.  You could also have sleep apnea.  You were completely treated for influenza.  We are discharging you on medication for your heart failure.  It is very important that you follow-up with primary care doctor and cardiologist in 1 week and have evaluation and blood work.  We also recommend asking your primary care doctor for referral to sleep clinic.  We strongly recommend lifestyle change to lose weight as your weight could contribute to your difficulty breathing.  Please read the directions on medication before you take them.  Once you are discharged, your primary care physician will handle any further medical issues. Please note that NO REFILLS for any discharge  medications will be authorized once you are discharged, as it is imperative that you return to your primary care physician (or establish a relationship with a primary care physician if you do not have one) for your aftercare needs so that they can reassess your need for medications and monitor your lab values. Take care,   Increase activity slowly   Complete by:  As directed      Allergies as of 09/27/2018      Reactions   Lactose Intolerance (gi) Diarrhea      Medication List    TAKE these medications   atorvastatin 20 MG tablet Commonly known as:  LIPITOR Take 1 tablet (20 mg total) by mouth daily at 6 PM.   metFORMIN 500 MG tablet Commonly known as:  GLUCOPHAGE Take 1 tablet (500 mg total) by mouth 2 (two) times daily with a meal.   montelukast 10 MG tablet Commonly known as:  SINGULAIR Take 1 tablet (10 mg total) by mouth at bedtime.   ProAir HFA 108 (90 Base) MCG/ACT inhaler Generic drug:  albuterol Inhale 2 puffs into the lungs every 6 (six) hours as needed for wheezing or shortness of breath.   spironolactone 25 MG tablet Commonly known as:  ALDACTONE Take 0.5 tablets (12.5 mg total)  by mouth daily. Start taking on:  September 28, 2018   torsemide 20 MG tablet Commonly known as:  DEMADEX Take 2 tablets (40 mg total) by mouth daily.      Follow-up Information    Go to to follow up.   Why:  [address] [place],  Phone # [phone] @ 10AM...... 11/04/2018 _0 :45PM # [phone]. Office will Call pt.  for inc. info.          Consultations:  Hematology by previous provider over the phone  Procedures/Studies: 2D Echo:   1. The left ventricle has hyperdynamic systolic function, with an ejection fraction of >65%. The cavity size was normal. There is moderate concentric left ventricular hypertrophy. Left ventricular diastolic Doppler parameters are consistent with  pseudonormalization. Elevated mean left atrial pressure There is right ventricular  pressure overload. No evidence of left ventricular regional wall motion abnormalities.  2. The tricuspid valve is grossly normal.  3. The aortic valve is grossly normal.  4. The aortic root and ascending aorta are normal in size and structure.  5. The interatrial septum was not assessed.  Ct Angio Chest Pe W Or Wo Contrast  Result Date: 09/20/2018 CLINICAL DATA:  Inpatient.  Dyspnea. EXAM: CT ANGIOGRAPHY CHEST WITH CONTRAST TECHNIQUE: Multidetector CT imaging of the chest was performed using the standard protocol during bolus administration of intravenous contrast. Multiplanar CT image reconstructions and MIPs were obtained to evaluate the vascular anatomy. CONTRAST:  85m OMNIPAQUE IOHEXOL 350 MG/ML SOLN COMPARISON:  Chest radiograph from one day prior. FINDINGS: Cardiovascular: The study is low to moderate quality for the evaluation of pulmonary embolism, limited by motion degradation and patient body habitus. There are no convincing filling defects in the central, lobar, segmental or subsegmental pulmonary artery branches to suggest acute pulmonary embolism. Normal course and caliber of the thoracic aorta. Dilated main pulmonary artery (4.3 cm diameter). Mild cardiomegaly. No significant pericardial fluid/thickening. Mediastinum/Nodes: No discrete thyroid nodules. Unremarkable esophagus. No pathologically enlarged axillary, mediastinal or hilar lymph nodes. Lungs/Pleura: No pneumothorax. No pleural effusion. There is patchy ground-glass opacity in both lungs, most prominent in right middle lobe. Scattered small parenchymal bands in left upper and right lower lobes compatible with mild scarring or atelectasis. No lung masses or significant pulmonary nodules. No consolidative airspace disease. Upper abdomen: No acute abnormality. Musculoskeletal:  No aggressive appearing focal osseous lesions. Review of the MIP images confirms the above findings. IMPRESSION: 1. Limited scan.  No evidence of pulmonary  embolism. 2. Dilated main pulmonary artery, suggesting pulmonary arterial hypertension. 3. Patchy ground-glass opacity in both lungs, most prominent in the right middle lobe. Consider pulmonary edema given the cardiomegaly, with the differential including atypical infection such as viral pneumonia. Electronically Signed   By: JIlona SorrelM.D.   On: 09/20/2018 11:53   Vas [place]Lower Extremity Venous (dvt)  Result Date: 09/22/2018  Lower Venous Study Indications: Edema.  Limitations: Body habitus and poor ultrasound/tissue interface. Performing Technologist: GOliver HumRVT  Examination Guidelines: A complete evaluation includes B-mode imaging, spectral Doppler, color Doppler, and power Doppler as needed of all accessible portions of each vessel. Bilateral testing is considered an integral part of a complete examination. Limited examinations for reoccurring indications may be performed as noted.  Right Venous Findings: +---------+---------------+---------+-----------+----------+--------------+            Compressibility Phasicity Spontaneity Properties Summary         +---------+---------------+---------+-----------+----------+--------------+  CFV       Full  Yes       Yes                                    +---------+---------------+---------+-----------+----------+--------------+  SFJ       Full                                                             +---------+---------------+---------+-----------+----------+--------------+  FV Prox   Full                                                             +---------+---------------+---------+-----------+----------+--------------+  FV Mid    Full                                                             +---------+---------------+---------+-----------+----------+--------------+  FV Distal Full            Yes       Yes                                    +---------+---------------+---------+-----------+----------+--------------+  PFV       Full                                                              +---------+---------------+---------+-----------+----------+--------------+  POP       Full            Yes       Yes                                    +---------+---------------+---------+-----------+----------+--------------+  PTV       Full                                                             +---------+---------------+---------+-----------+----------+--------------+  PERO                                                       Not visualized  +---------+---------------+---------+-----------+----------+--------------+  Left Venous Findings: +---------+---------------+---------+-----------+----------+--------------+            Compressibility Phasicity Spontaneity Properties Summary         +---------+---------------+---------+-----------+----------+--------------+  CFV       Full            Yes       Yes                                    +---------+---------------+---------+-----------+----------+--------------+  SFJ       Full                                                             +---------+---------------+---------+-----------+----------+--------------+  FV Prox   Full                                                             +---------+---------------+---------+-----------+----------+--------------+  FV Mid    Full                                                             +---------+---------------+---------+-----------+----------+--------------+  FV Distal Full                                                             +---------+---------------+---------+-----------+----------+--------------+  PFV       Full                                                             +---------+---------------+---------+-----------+----------+--------------+  POP       Full            Yes       Yes                                    +---------+---------------+---------+-----------+----------+--------------+  PTV       Full                                                              +---------+---------------+---------+-----------+----------+--------------+  PERO                                                       Not visualized  +---------+---------------+---------+-----------+----------+--------------+  Summary: Right: There is no evidence of deep vein thrombosis in the lower extremity. However, portions of this examination were limited- see technologist comments above. No cystic structure found in the popliteal fossa. Left: There is no evidence of deep vein thrombosis in the lower extremity. However, portions of this examination were limited- see technologist comments above. No cystic structure found in the popliteal fossa.  *See table(s) above for measurements and observations. Electronically signed by Monica Martinez MD on 09/22/2018 at 5:15:27 PM.    Final       Subjective: No major events overnight of this morning.  Excited and ready to go home.  No complaint.  Discharge Exam: Vitals:   09/27/18 1100 09/27/18 1113  BP:  131/80  Pulse: 91 90  Resp:    Temp:  98.2 F (36.8 C)  SpO2: 90% 97%    GENERAL: Appears well. No acute distress.  Morbidly obese HEENT: MMM.  Vision and Hearing grossly intact.  NECK: Supple.  Difficult to assess JVD due to body habitus LUNGS:  No IWOB. Good air movement. CTAB.  HEART:  RRR. Heart sounds normal. ABD: Bowel sounds present. Soft. Non tender.  Limited exam due to body habitus EXT: Trace edema and venous sufficiency bilaterally SKIN: Mild stasis dermatitis NEURO: Awake, alert and oriented appropriately.  No gross deficit.  PSYCH: Happy and excited.  The results of significant diagnostics from this hospitalization (including imaging, microbiology, ancillary and laboratory) are listed below for reference.     Microbiology: No results found for this or any previous visit (from the past 240 hour(s)).   Labs: BNP (last 3 results) No results for input(s): BNP in the last 8760 hours. Basic  Metabolic Panel: Recent Labs  Lab 09/23/18 0233 09/24/18 0342 09/25/18 0230 09/26/18 0215 09/27/18 0213  NA 140 139 140 138 138  K 5.6* 4.0 3.6 3.6 3.1*  CL 85* 87* 88* 91* 92*  CO2 42* 37* 40* 39* 34*  GLUCOSE 96 100* 103* 108* 93  BUN _0 21*  CREATININE 1.25* 1.09 1.10 1.11 1.30*  CALCIUM 9.1 9.6 9.5 9.8 9.6  MG 2.1 1.9 2.0 2.0 2.1   Liver Function Tests: Recent Labs  Lab 09/21/18 0005  AST 25  ALT 31  ALKPHOS 61  BILITOT 1.1  PROT 6.4*  ALBUMIN 3.6   No results for input(s): LIPASE, AMYLASE in the last 168 hours. No results for input(s): AMMONIA in the last 168 hours. CBC: Recent Labs  Lab 09/21/18 0005  09/23/18 0233 09/24/18 0342 09/25/18 0230 09/26/18 0215 09/27/18 0213  WBC 8.5   < > 8.7 8.0 10.0 9.0 8.2  NEUTROABS 6.3  --   --   --   --   --   --   HGB 17.7*   < > 17.2* 17.1* 17.0 17.2* 16.7  HCT 61.0*   < > 57.8* 56.2* 57.5* 57.4* 56.6*  MCV 83.6   < > 83.9 82.0 82.9 82.1 82.0  PLT 187   < > 176 144* 196 188 162   < > = values in this interval not displayed.   Cardiac Enzymes: Recent Labs  Lab 09/20/18 1414 09/21/18 0005 09/21/18 0802  CKTOTAL 136  --   --   TROPONINI 0.03* <0.03 0.03*   BNP: Invalid input(s): POCBNP CBG: Recent Labs  Lab 09/26/18 0650  GLUCAP 77   D-Dimer No results for input(s): DDIMER in the last 72 hours. Hgb A1c No results for input(s): HGBA1C in the last 72 hours.  Lipid Profile No results for input(s): CHOL, HDL, LDLCALC, TRIG, CHOLHDL, LDLDIRECT in the last 72 hours. Thyroid function studies No results for input(s): TSH, T4TOTAL, T3FREE, THYROIDAB in the last 72 hours.  Invalid input(s): FREET3 Anemia work up No results for input(s): VITAMINB12, FOLATE, FERRITIN, TIBC, IRON, RETICCTPCT in the last 72 hours. Urinalysis No results found for: COLORURINE, APPEARANCEUR, [place], [place], GLUCOSEU, HGBUR, BILIRUBINUR, KETONESUR, PROTEINUR, UROBILINOGEN, NITRITE, LEUKOCYTESUR Sepsis Labs Invalid  input(s): PROCALCITONIN,  WBC,  LACTICIDVEN   Time coordinating discharge: 25 minutes  SIGNED:  Mercy Riding, MD  [facility] 09/27/2018, 12:51 PM Pager [phone]  If 7PM-7AM, please contact night-coverage [email or web address] Password [email or web address]

## 2018-09-27 NOTE — Progress Notes (Signed)
Pt ambulated in a hallway without distress, oxygen was 94% in RA, informed to MD, waiting for the discharge order  Palma Holter, RN

## 2018-09-29 NOTE — Care Management (Signed)
Called and spoke with Frank Watts and he was already in the process of making follow-up hospital appointment, and declined my assistance with this.I informed him that he could call me back if he needed my assistance.  Madelin Headings, Care Management Assistant

## 2018-11-21 NOTE — Progress Notes (Deleted)
Cardiology Office Note:    Date:  11/21/2018   ID:  Frank Watts, DOB 09/20/1980, MRN [id number]  PCP:  No primary care provider on file.  Cardiologist:  Shirlee More, MD   Referring MD: No ref. provider found  ASSESSMENT:    No diagnosis found. PLAN:    In order of problems listed above:  1. ***  Next appointment   Medication Adjustments/Labs and Tests Ordered: Current medicines are reviewed at length with the patient today.  Concerns regarding medicines are outlined above.  No orders of the defined types were placed in this encounter.  No orders of the defined types were placed in this encounter.    No chief complaint on file. ***  History of Present Illness:    Frank Watts is a 38 y.o. male with recent [facility] admission 09/18/2020 09/21/2018 with influenza AMB hypoxia morbid obesity with a BMI of 70.5 hypertension and sleep apnea who is being seen today for the evaluation of heart failure at the request of No ref. provider found. An echocardiogram while in the hospital that showed an ejection fraction of greater than 65% moderate left ventricular hypertrophy and diastolic dysfunction was seen with pseudo-normalization elevated left atrial pressure.  He also had evidence of RV dysfunction with pressure overload the pulmonary artery is dilated on CT scan consistent with pulmonary artery hypertension.  Clinically he is felt to have had diastolic heart failure and required IV diuretics and transitioned to oral torsemide.  There is a recommendation at discharge to be referred to cardiology is also pending a sleep study.  Past Medical History:  Diagnosis Date  . HTN (hypertension)     Past Surgical History:  Procedure Laterality Date  . OTHER SURGICAL HISTORY     Bicep surgery    Current Medications: No outpatient medications have been marked as taking for the 11/24/18 encounter (Appointment) with Richardo Priest, MD.     Allergies:   Lactose  intolerance (gi)   Social History   Socioeconomic History  . Marital status: Single    Spouse name: Not on file  . Number of children: Not on file  . Years of education: Not on file  . Highest education level: Not on file  Occupational History  . Not on file  Social Needs  . Financial resource strain: Not on file  . Food insecurity    Worry: Not on file    Inability: Not on file  . Transportation needs    Medical: Not on file    Non-medical: Not on file  Tobacco Use  . Smoking status: Never Smoker  . Smokeless tobacco: Never Used  Substance and Sexual Activity  . Alcohol use: Not on file  . Drug use: Not on file  . Sexual activity: Not on file  Lifestyle  . Physical activity    Days per week: Not on file    Minutes per session: Not on file  . Stress: Not on file  Relationships  . Social Herbalist on phone: Not on file    Gets together: Not on file    Attends religious service: Not on file    Active member of club or organization: Not on file    Attends meetings of clubs or organizations: Not on file    Relationship status: Not on file  Other Topics Concern  . Not on file  Social History Narrative  . Not on file     Family History: The patient's ***  family history includes Hypertension in his mother; Prostate cancer in his father.  ROS:   ROS Please see the history of present illness.    *** All other systems reviewed and are negative.  EKGs/Labs/Other Studies Reviewed:    The following studies were reviewed today: ***  EKG:  EKG is *** ordered today.  The ekg ordered today is personally reviewed and demonstrates ***  Recent Labs: 09/21/2018: ALT 31 09/27/2018: BUN 21; Creatinine, Ser 1.30; Hemoglobin 16.7; Magnesium 2.1; Platelets 162; Potassium 3.1; Sodium 138  Recent Lipid Panel    Component Value Date/Time   CHOL 144 09/22/2018 0758   TRIG 76 09/22/2018 0758   HDL 34 (L) 09/22/2018 0758   CHOLHDL 4.2 09/22/2018 0758   VLDL 15 09/22/2018  0758   LDLCALC 95 09/22/2018 0758    Physical Exam:    VS:  There were no vitals taken for this visit.    Wt Readings from Last 3 Encounters:  09/27/18 (!) 488 lb 15.7 oz (221.8 kg)     GEN: *** Well nourished, well developed in no acute distress HEENT: Normal NECK: No JVD; No carotid bruits LYMPHATICS: No lymphadenopathy CARDIAC: ***RRR, no murmurs, rubs, gallops RESPIRATORY:  Clear to auscultation without rales, wheezing or rhonchi  ABDOMEN: Soft, non-tender, non-distended MUSCULOSKELETAL:  No edema; No deformity  SKIN: Warm and dry NEUROLOGIC:  Alert and oriented x 3 PSYCHIATRIC:  Normal affect     Signed, Shirlee More, MD  11/21/2018 10:51 AM    [place]

## 2018-11-24 ENCOUNTER — Ambulatory Visit: Payer: PRIVATE HEALTH INSURANCE | Admitting: Cardiology

## 2018-12-16 ENCOUNTER — Encounter: Payer: Self-pay | Admitting: Cardiology

## 2018-12-16 NOTE — Progress Notes (Signed)
Cardiology Office Note:    Date:  12/17/2018   ID:  Frank Watts, DOB [birth year], MRN [id number]  PCP:  Pc, [facility]  Cardiologist:  Shirlee More, MD    Referring MD: No ref. provider found    ASSESSMENT:    1. Chronic diastolic heart failure ([place])   2. Hypertensive heart disease with heart failure ([place])   3. Adult BMI >=70 kg/sq m [facility])    PLAN:    In order of problems listed above:  1. Heart failure markedly improved looks compensated and I am unsure how much of this was heart failure, much with pseudo-heart failure from severe obstructive sleep apnea how much of this was acute lung injury from influenza and how much was fluid overload from hospitalization.  Regardless we will continue his diuretic decrease torsemide to 20 mg once a day and he has arrangements for labs to include BMP proBNP at the city tomorrow.  I will see back in 3 months. 2. Hypertension stable blood pressure by me 06/22/1978 3. Morbid obesity would benefit from bariatric surgery 4. 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  Concerns regarding medicines are outlined above.  No orders of the defined types were placed in this encounter.  No orders of the defined types were placed in this encounter.   No chief complaint on file.   History of Present Illness:    Frank Watts is a 38 y.o. male with a hx of hypertensive heart disease, morbid obesity and acute diastolic heart failure during hospitalization [facility] 09/19/2018 with influenza A and B pneumonia.  He was not seen by cardiology during the admission.  He is a new patient referral from Dr Cyndia Skeeters at [facility] discharge for heart failure.  Review of hospital records shows echocardiogram 09/21/2018 with hyperdynamic ejection fraction greater than  65% moderate concentric left ventricular hypertrophy and pseudo-normal diastolic dysfunction with elevated left atrial pressure.  He also had evidence of right ventricular pressure overload.  CTA chest 09/20/2018 showed no evidence of pulmonary embolism and he had patchy groundglass opacity in both lungs most prominent in the right middle lobe representing pneumonia and/or heart failure.  His EKG 09/20/2018 showed sinus rhythm right bundle branch block right axis deviation and findings suggesting right ventricular hypertrophy.  I do not find a proBNP level performed during that admission.  With BMI greater than 70 there is a notation that he is polycythemia felt to be due to sleep apnea and needs follow-up CBC and arrangements for sleep test at discharge.  Compliance with diet, lifestyle and medications: Yes  Since discharge he feels dramatically better he is now on CPAP for severe obstructive sleep apnea he has been taking his diuretic every other day feels he is over diuresed he is not short of breath no orthopnea his edema is cleared.  No chest pain palpitation or syncope.  He is contemplating interventions for his BMI greater than 70 Past Medical History:  Diagnosis Date  . Acute CHF (congestive heart failure) ([place]) 09/19/2018  . Acute diastolic CHF (congestive heart failure) ([place]) 09/22/2018  . Acute diastolic CHF (congestive heart failure) ([place]) 09/22/2018  . Acute hypoxemic respiratory failure ([place]) 09/19/2018  . Essential hypertension 09/19/2018  . HTN (hypertension)   . Influenza 09/19/2018  . Morbid obesity 09/19/2018  . OSA (  obstructive sleep apnea) 09/25/2018  . Polycythemia 09/25/2018    Past Surgical History:  Procedure Laterality Date  . OTHER SURGICAL HISTORY     Bicep surgery    Current Medications: Current Meds  Medication Sig  . atorvastatin (LIPITOR) 20 MG tablet Take 1 tablet (20 mg total) by mouth daily at 6 PM.  . metFORMIN (GLUCOPHAGE) 500 MG tablet Take 500 mg by mouth  daily.  . montelukast (SINGULAIR) 10 MG tablet Take 1 tablet (10 mg total) by mouth at bedtime.  Marland Kitchen spironolactone (ALDACTONE) 25 MG tablet Take 0.5 tablets (12.5 mg total) by mouth daily.  Marland Kitchen torsemide (DEMADEX) 20 MG tablet Take 2 tablets (40 mg total) by mouth daily.     Allergies:   Lactose intolerance (gi)   Social History   Socioeconomic History  . Marital status: Single    Spouse name: Not on file  . Number of children: Not on file  . Years of education: Not on file  . Highest education level: Not on file  Occupational History  . Not on file  Social Needs  . Financial resource strain: Not on file  . Food insecurity    Worry: Not on file    Inability: Not on file  . Transportation needs    Medical: Not on file    Non-medical: Not on file  Tobacco Use  . Smoking status: Never Smoker  . Smokeless tobacco: Never Used  Substance and Sexual Activity  . Alcohol use: Not on file  . Drug use: Not on file  . Sexual activity: Not on file  Lifestyle  . Physical activity    Days per week: Not on file    Minutes per session: Not on file  . Stress: Not on file  Relationships  . Social Herbalist on phone: Not on file    Gets together: Not on file    Attends religious service: Not on file    Active member of club or organization: Not on file    Attends meetings of clubs or organizations: Not on file    Relationship status: Not on file  Other Topics Concern  . Not on file  Social History Narrative  . Not on file     Family History: The patient's family history includes Hypertension in his mother; Prostate cancer in his father. ROS:   Please see the history of present illness.    All other systems reviewed and are negative.  EKGs/Labs/Other Studies Reviewed:    The following studies were reviewed today:  EKG:  EKG ordered today and personally reviewed.  The ekg ordered today demonstrates sinus rhythm right bundle branch block left axis deviation  Recent  Labs: 09/21/2018: ALT 31 09/27/2018: BUN 21; Creatinine, Ser 1.30; Hemoglobin 16.7; Magnesium 2.1; Platelets 162; Potassium 3.1; Sodium 138  Recent Lipid Panel    Component Value Date/Time   CHOL 144 09/22/2018 0758   TRIG 76 09/22/2018 0758   HDL 34 (L) 09/22/2018 0758   CHOLHDL 4.2 09/22/2018 0758   VLDL 15 09/22/2018 0758   LDLCALC 95 09/22/2018 0758    Physical Exam:    VS:  There were no vitals taken for this visit.    Wt Readings from Last 3 Encounters:  09/27/18 (!) 488 lb 15.7 oz (221.8 kg)     GEN: Marked central obesity well nourished, well developed in no acute distress HEENT: Normal NECK: No JVD; No carotid bruits LYMPHATICS: No lymphadenopathy CARDIAC: RRR, no murmurs, rubs, gallops  RESPIRATORY:  Clear to auscultation without rales, wheezing or rhonchi  ABDOMEN: Soft, non-tender, non-distended MUSCULOSKELETAL: Bilateral 1+ to the knee edema; No deformity  SKIN: Warm and dry NEUROLOGIC:  Alert and oriented x 3 PSYCHIATRIC:  Normal affect    Signed, Shirlee More, MD  12/17/2018 11:26 AM    [place]

## 2018-12-17 ENCOUNTER — Encounter: Payer: Self-pay | Admitting: Cardiology

## 2018-12-17 ENCOUNTER — Ambulatory Visit (INDEPENDENT_AMBULATORY_CARE_PROVIDER_SITE_OTHER): Payer: PRIVATE HEALTH INSURANCE | Admitting: Cardiology

## 2018-12-17 ENCOUNTER — Other Ambulatory Visit: Payer: Self-pay

## 2018-12-17 VITALS — HR 77 | Temp 97.2°F | Ht 68.0 in | Wt >= 6400 oz

## 2018-12-17 DIAGNOSIS — Z6841 Body Mass Index (BMI) 40.0 and over, adult: Secondary | ICD-10-CM | POA: Diagnosis not present

## 2018-12-17 DIAGNOSIS — I110 Hypertensive heart disease with heart failure: Secondary | ICD-10-CM

## 2018-12-17 DIAGNOSIS — I5032 Chronic diastolic (congestive) heart failure: Secondary | ICD-10-CM

## 2018-12-17 MED ORDER — TORSEMIDE 20 MG PO TABS: 20.0000 mg | ORAL_TABLET | Freq: Every day | ORAL | 0 refills | Status: DC

## 2018-12-17 NOTE — Patient Instructions (Signed)
Medication Instructions:  Your physician has recommended you make the following change in your medication:   DECREASE torsemide (demadex) 20 mg: Take 1 tablet daily  If you need a refill on your cardiac medications before your next appointment, please call your pharmacy.   Lab work: Your physician recommends that you return for lab work within the next week: BMP, ProBNP. Please have these labs drawn at the [facility] and fax these results to [phone].   If you have labs (blood work) drawn today and your tests are completely normal, you will receive your results only by: Marland Kitchen MyChart Message (if you have MyChart) OR . A paper copy in the mail If you have any lab test that is abnormal or we need to change your treatment, we will call you to review the results.  Testing/Procedures: You had an EKG today.   Follow-Up: At [facility], you and your health needs are our priority.  As part of our continuing mission to provide you with exceptional heart care, we have created designated Provider Care Teams.  These Care Teams include your primary Cardiologist (physician) and Advanced Practice Providers (APPs -  Physician Assistants and Nurse Practitioners) who all work together to provide you with the care you need, when you need it. You will need a follow up appointment in 3 months.

## 2018-12-17 NOTE — Addendum Note (Signed)
Addended by: Austin Miles on: 12/17/2018 12:00 PM   Modules accepted: Orders

## 2019-03-23 ENCOUNTER — Ambulatory Visit: Payer: PRIVATE HEALTH INSURANCE | Admitting: Cardiology

## 2019-04-21 ENCOUNTER — Ambulatory Visit: Payer: PRIVATE HEALTH INSURANCE | Admitting: Cardiology

## 2020-06-10 IMAGING — CT CT ANGIOGRAPHY CHEST
2 of 8 series · 16 of 46 positions shown · IV contrast (APPLIED)
Comparison: Chest radiograph from one day prior.

CLINICAL DATA: Inpatient.  Dyspnea.

EXAM:
CT ANGIOGRAPHY CHEST WITH CONTRAST
TECHNIQUE: Multidetector CT imaging of the chest was performed using the
standard protocol during bolus administration of intravenous
contrast. Multiplanar CT image reconstructions and MIPs were
obtained to evaluate the vascular anatomy.
CONTRAST:  75mL OMNIPAQUE IOHEXOL 350 MG/ML SOLN

[Series 7: thins · axial · 0.98mm/px · z∈[+1141,+1406]mm · 13 of 293 slices shown]
[im 14/293  lung]
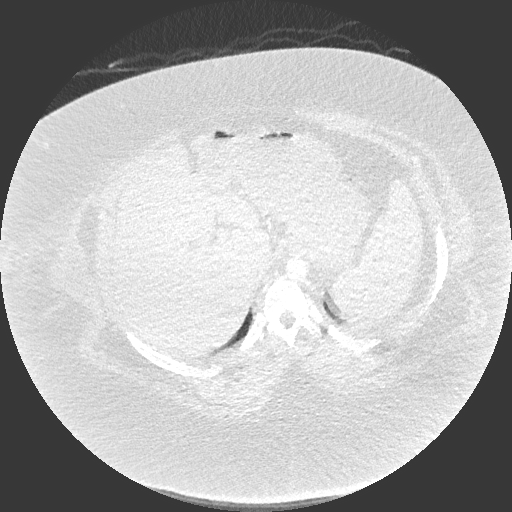
[im 40/293  soft-tissue]
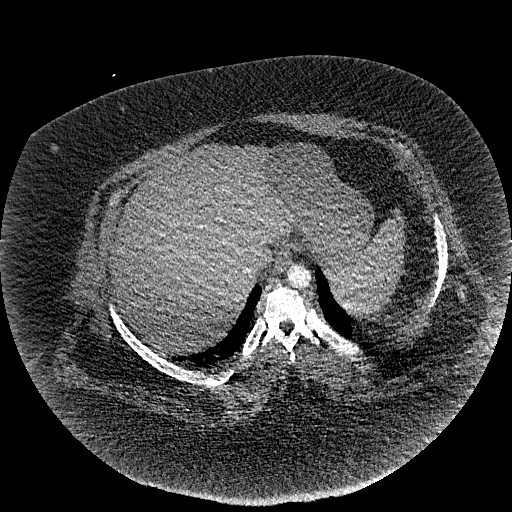
[im 67/293  lung]
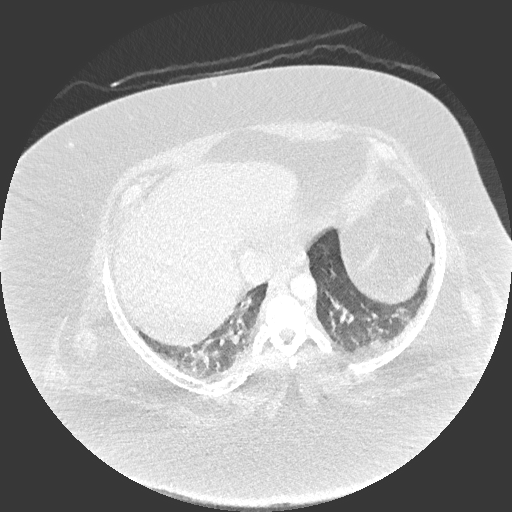
[im 80/293  soft-tissue]
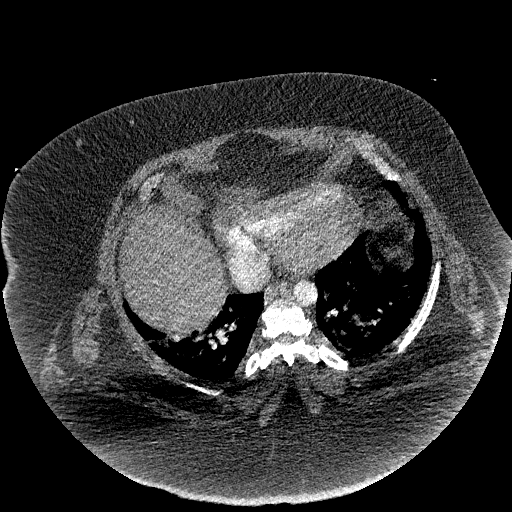
[im 107/293  lung]
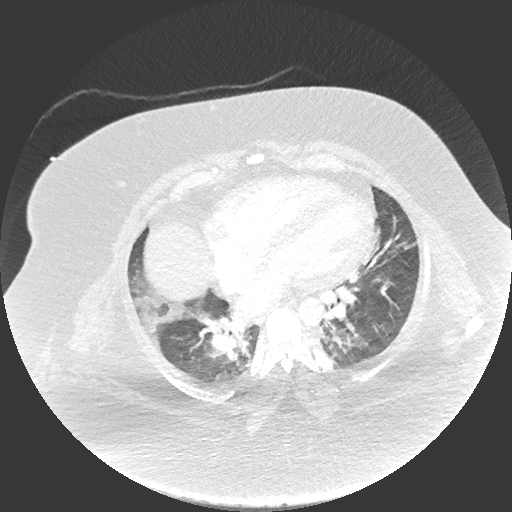
[im 120/293  soft-tissue]
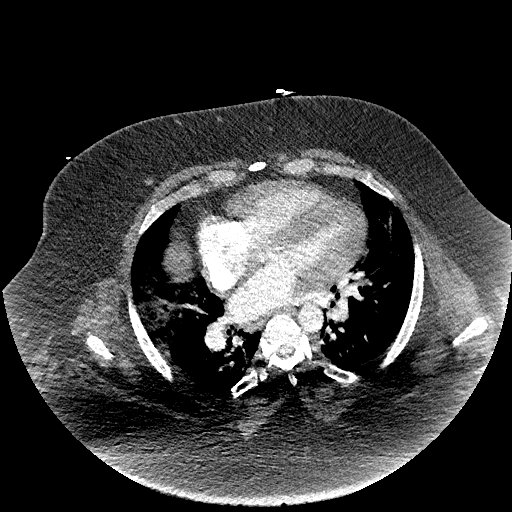
[im 147/293  lung]
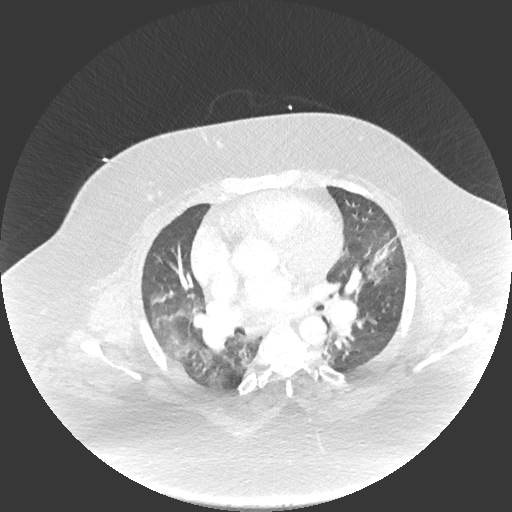
[im 173/293  soft-tissue]
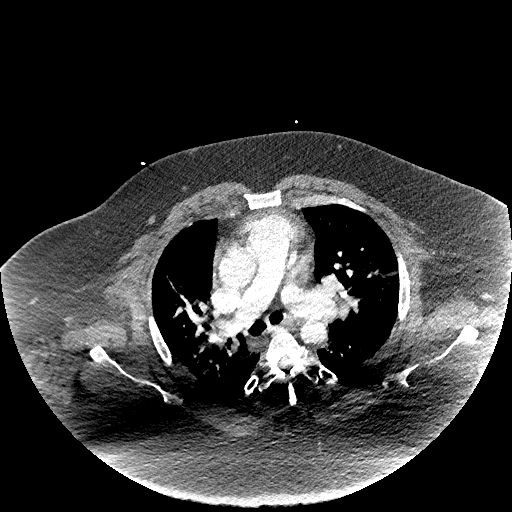
[im 186/293  lung]
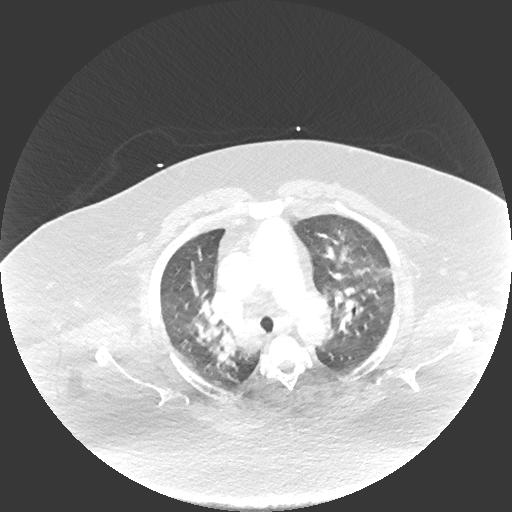
[im 213/293  soft-tissue]
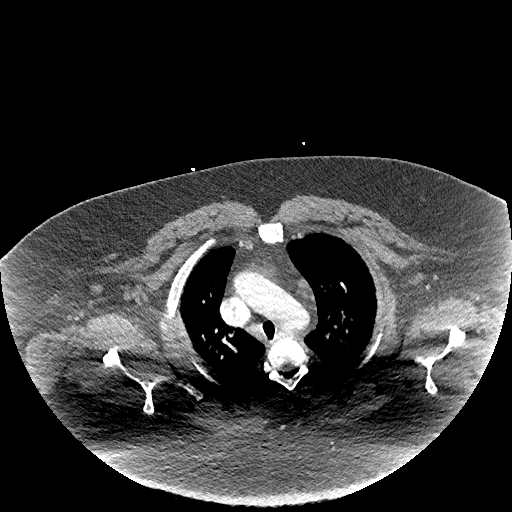
[im 226/293  lung]
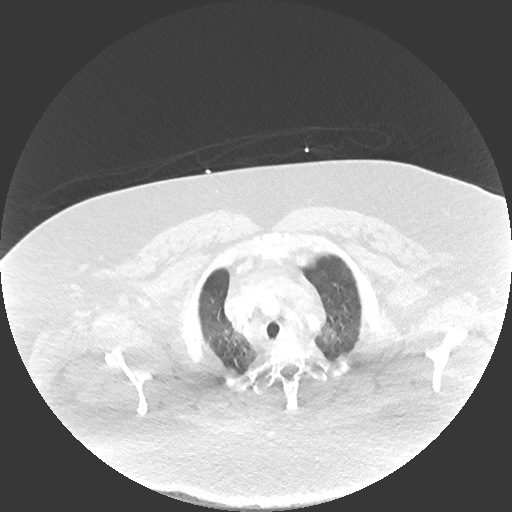
[im 253/293  soft-tissue]
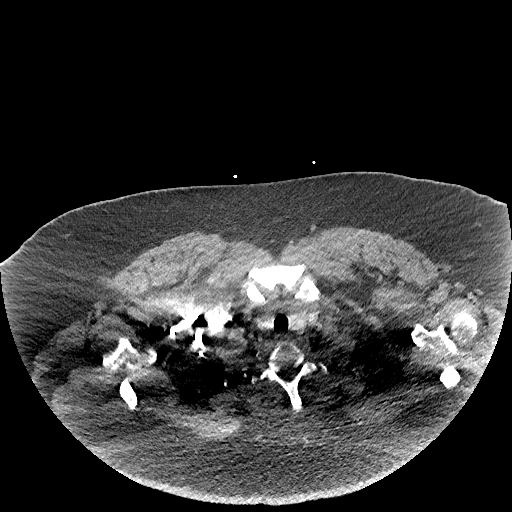
[im 279/293  lung]
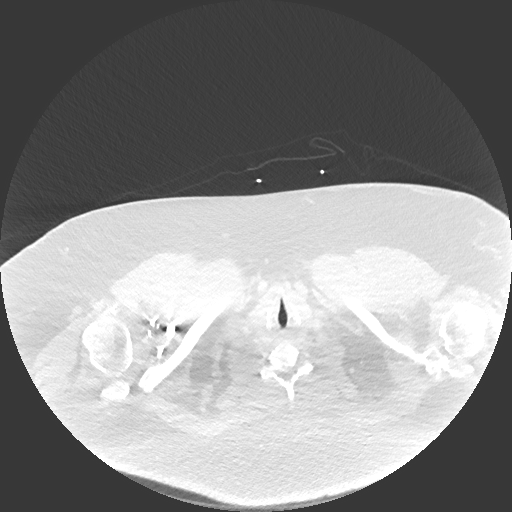

[Series 9: coronal mpr · coronal · 0.61mm/px · 3 of 220 slices shown]
[im 55/220  soft-tissue]
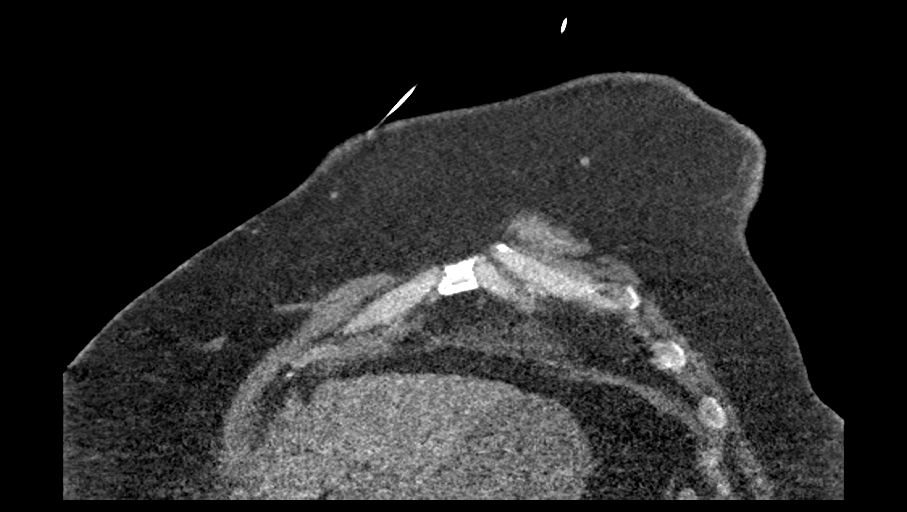
[im 110/220  soft-tissue]
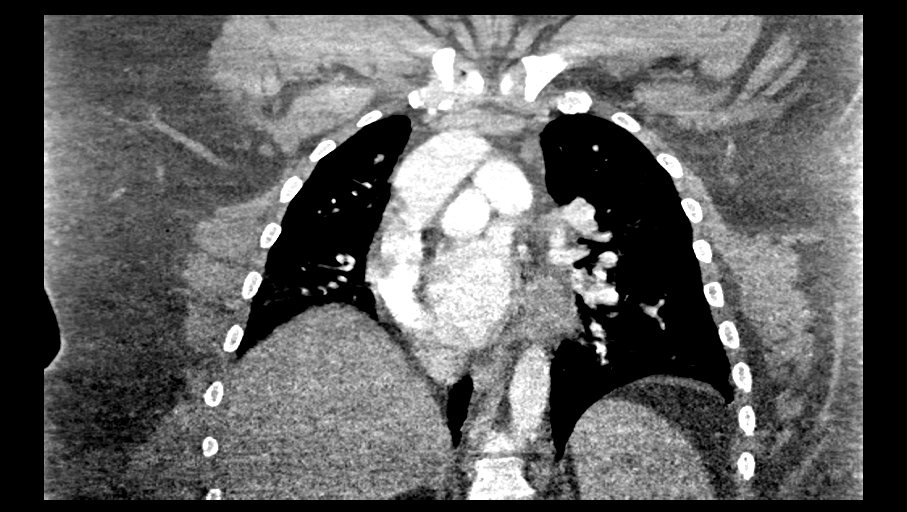
[im 165/220  soft-tissue]
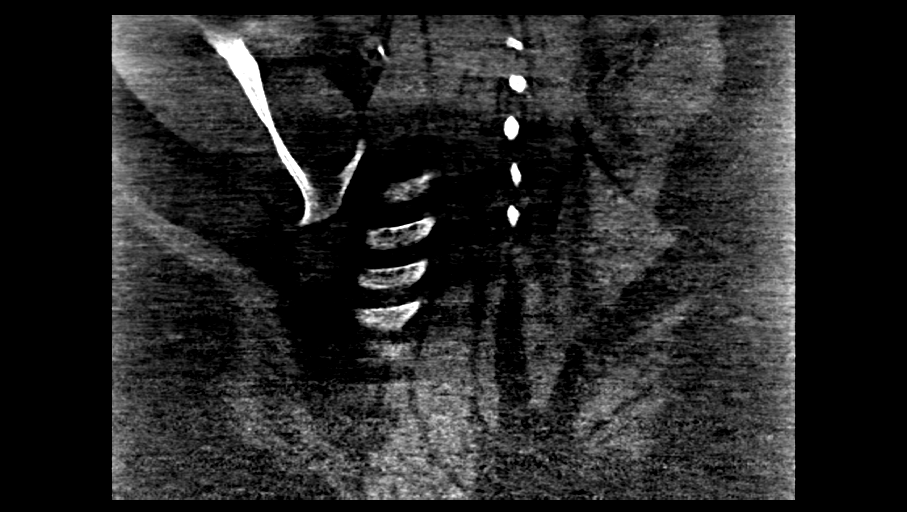

[16 of 46 positions shown; findings below may reference images not displayed]

FINDINGS: Cardiovascular: The study is low to moderate quality for the
evaluation of pulmonary embolism, limited by motion degradation and
patient body habitus. There are no convincing filling defects in the
central, lobar, segmental or subsegmental pulmonary artery branches
to suggest acute pulmonary embolism. Normal course and caliber of
the thoracic aorta. Dilated main pulmonary artery (4.3 cm diameter).
Mild cardiomegaly. No significant pericardial fluid/thickening.

Mediastinum/Nodes: No discrete thyroid nodules. Unremarkable
esophagus. No pathologically enlarged axillary, mediastinal or hilar
lymph nodes.

Lungs/Pleura: No pneumothorax. No pleural effusion. There is patchy
ground-glass opacity in both lungs, most prominent in right middle
lobe. Scattered small parenchymal bands in left upper and right
lower lobes compatible with mild scarring or atelectasis. No lung
masses or significant pulmonary nodules. No consolidative airspace
disease.

Upper abdomen: No acute abnormality.

Musculoskeletal:  No aggressive appearing focal osseous lesions.

Review of the MIP images confirms the above findings.
IMPRESSION: 1. Limited scan.  No evidence of pulmonary embolism.
2. Dilated main pulmonary artery, suggesting pulmonary arterial
hypertension.
3. Patchy ground-glass opacity in both lungs, most prominent in the
right middle lobe. Consider pulmonary edema given the cardiomegaly,
with the differential including atypical infection such as viral
pneumonia.

## 2020-12-14 NOTE — Progress Notes (Signed)
Cardiology Office Note:    Date:  12/15/2020   ID:  Frank Watts, DOB 1981-02-04, MRN [id number]  PCP:  Pc, [facility]  Cardiologist:  Shirlee More, MD    Referring MD: Pc, [facility]*    ASSESSMENT:    1. Hypertensive heart disease with heart failure ([place])   2. Chronic diastolic heart failure ([place])   3. Adult BMI >=70 kg/sq m ([place])   4. RBBB    PLAN:    In order of problems listed above:  Stable difficult to measure blood pressure I think he do best to use a wrist cuff good technique level of the heart I asked him to purchase a digital record for 2 weeks and send it 3 through [place].  If additional medication is needed ARB would be appropriate.  Avoid calcium channel blockers with his heart failure Stable continue his current loop and distal diuretic I encouraged him to consider bariatric surgery Stable EKG pattern   Next appointment: Swallowing dysfunction he is pending evaluation by GI I will see him in my office 1 year   Medication Adjustments/Labs and Tests Ordered: Current medicines are reviewed at length with the patient today.  Concerns regarding medicines are outlined above.  No orders of the defined types were placed in this encounter.  No orders of the defined types were placed in this encounter.   Chief Complaint  Patient presents with   Follow-up   Congestive Heart Failure   Hypertension    History of Present Illness:    Frank Watts is a 40 y.o. male with a hx of hypertensive heart disease with chronic diastolic heart failure and morbid obesity with a BMI exceeding 70 last seen 12/17/2018.  He developed acute diastolic heart failure during hospitalization [place]: 09/19/2018 with influenza A and B pneumonia.  Review of records shows he had hyperdynamic systolic function on echocardiogram EF greater than 65% moderate concentric LVH and pseudo normal diastolic dysfunction with elevated left atrial pressure at that  time.  He also had evidence of right ventricular pressure overload.  His EKG showed right bundle branch block. Compliance with diet, lifestyle and medications: Yes  Recently placed on spironolactone for blood pressure effect Systolic was greater than 150. I rechecked his blood pressure right upper extremity at the wrist level of the heart within normal cough with obesity and repeat blood pressure 122/80 He is not having edema shortness of breath chest pain palpitation or syncope. Recent labs 09/22/2018 shows a cholesterol 144 LDL 95 A1c 6.2% creatinine 1.3.  [facility] His predominant complaint is coughing after he swallows he is concerned of swallowing dysfunction saw ENT did not get an answer and he has plans to see GI.  I encouraged him to request a swallowing study Past Medical History:  Diagnosis Date   Acute CHF (congestive heart failure) ([place]) 54/24/8144   Acute diastolic CHF (congestive heart failure) ([place]) 09/22/2018   Acute hypoxemic respiratory failure ([place]) 09/19/2018   Essential hypertension 09/19/2018   HTN (hypertension)    Influenza 09/19/2018   Morbid obesity 09/19/2018   OSA (obstructive sleep apnea) 09/25/2018   Polycythemia 09/25/2018    Past Surgical History:  Procedure Laterality Date   OTHER SURGICAL HISTORY     Bicep surgery    Current Medications: Current Meds  Medication Sig   atorvastatin (LIPITOR) 20 MG tablet Take 1 tablet (20 mg total) by mouth daily at 6 PM.  metFORMIN (GLUCOPHAGE) 500 MG tablet Take 500 mg by mouth daily.   pantoprazole (PROTONIX) 40 MG tablet Take 40 mg by mouth daily.   spironolactone (ALDACTONE) 25 MG tablet Take 25 mg by mouth daily.   torsemide (DEMADEX) 20 MG tablet Take 1 tablet (20 mg total) by mouth daily.     Allergies:   Lactose intolerance (gi)   Social History   Socioeconomic History   Marital status: Single    Spouse name: Not on file   Number of children: Not on  file   Years of education: Not on file   Highest education level: Not on file  Occupational History   Not on file  Tobacco Use   Smoking status: Never   Smokeless tobacco: Never  Vaping Use   Vaping Use: Never used  Substance and Sexual Activity   Alcohol use: Not on file    Comment: Occasional   Drug use: Never   Sexual activity: Not on file  Other Topics Concern   Not on file  Social History Narrative   Not on file   Social Determinants of Health   Financial Resource Strain: Not on file  Food Insecurity: Not on file  Transportation Needs: Not on file  Physical Activity: Not on file  Stress: Not on file  Social Connections: Not on file     Family History: The patient's family history includes Hypertension in his brother and mother; Prostate cancer in his father. ROS:   Please see the history of present illness.    All other systems reviewed and are negative.  EKGs/Labs/Other Studies Reviewed:    The following studies were reviewed today:  EKG:  EKG ordered today and personally reviewed.  The ekg ordered today demonstrates sinus rhythm right axis deviation right bundle branch block unchanged  Recent Labs: No results found for requested labs within last 8760 hours.  Recent Lipid Panel    Component Value Date/Time   CHOL 144 09/22/2018 0758   TRIG 76 09/22/2018 0758   HDL 34 (L) 09/22/2018 0758   CHOLHDL 4.2 09/22/2018 0758   VLDL 15 09/22/2018 0758   LDLCALC 95 09/22/2018 0758    Physical Exam:    VS:  BP (!) 142/88 (BP Location: Right Arm, Patient Position: Sitting, Cuff Size: Normal)   Pulse 88   Ht 5' 8" (1.727 m)   Wt (!) 491 lb (222.7 kg)   SpO2 93%   BMI 74.66 kg/m     Wt Readings from Last 3 Encounters:  12/15/20 (!) 491 lb (222.7 kg)  12/17/18 (!) 486 lb (220.4 kg)  09/27/18 (!) 488 lb 15.7 oz (221.8 kg)     GEN: Marked obesity BMI greater than 70 well nourished, well developed in no acute distress HEENT: Normal NECK: No JVD; No carotid  bruits LYMPHATICS: No lymphadenopathy CARDIAC: RRR, no murmurs, rubs, gallops RESPIRATORY:  Clear to auscultation without rales, wheezing or rhonchi  ABDOMEN: Soft, non-tender, non-distended MUSCULOSKELETAL:  No edema; No deformity  SKIN: Warm and dry NEUROLOGIC:  Alert and oriented x 3 PSYCHIATRIC:  Normal affect    Signed, Shirlee More, MD  12/15/2020 9:37 AM    [place]

## 2020-12-15 ENCOUNTER — Other Ambulatory Visit: Payer: Self-pay

## 2020-12-15 ENCOUNTER — Encounter: Payer: Self-pay | Admitting: Cardiology

## 2020-12-15 ENCOUNTER — Ambulatory Visit: Payer: PRIVATE HEALTH INSURANCE | Admitting: Cardiology

## 2020-12-15 VITALS — BP 122/80 | HR 88 | Ht 68.0 in | Wt >= 6400 oz

## 2020-12-15 DIAGNOSIS — I4510 Unspecified right bundle-branch block: Secondary | ICD-10-CM | POA: Diagnosis not present

## 2020-12-15 DIAGNOSIS — I110 Hypertensive heart disease with heart failure: Secondary | ICD-10-CM

## 2020-12-15 DIAGNOSIS — I5032 Chronic diastolic (congestive) heart failure: Secondary | ICD-10-CM

## 2020-12-15 DIAGNOSIS — Z6841 Body Mass Index (BMI) 40.0 and over, adult: Secondary | ICD-10-CM | POA: Diagnosis not present

## 2020-12-15 NOTE — Patient Instructions (Signed)
Medication Instructions:  Your physician recommends that you continue on your current medications as directed. Please refer to the Current Medication list given to you today.  *If you need a refill on your cardiac medications before your next appointment, please call your pharmacy*   Lab Work: None If you have labs (blood work) drawn today and your tests are completely normal, you will receive your results only by: [place] (if you have MyChart) OR A paper copy in the mail If you have any lab test that is abnormal or we need to change your treatment, we will call you to review the results.   Testing/Procedures: None   Follow-Up: At [facility], you and your health needs are our priority.  As part of our continuing mission to provide you with exceptional heart care, we have created designated Provider Care Teams.  These Care Teams include your primary Cardiologist (physician) and Advanced Practice Providers (APPs -  Physician Assistants and Nurse Practitioners) who all work together to provide you with the care you need, when you need it.  We recommend signing up for the patient portal called "MyChart".  Sign up information is provided on this After Visit Summary.  MyChart is used to connect with patients for Virtual Visits (Telemedicine).  Patients are able to view lab/test results, encounter notes, upcoming appointments, etc.  Non-urgent messages can be sent to your provider as well.   To learn more about what you can do with MyChart, go to [email or web address].    Your next appointment:   1 year(s)  The format for your next appointment:   In Person  Provider:   Shirlee More, MD   Other Instructions Please take your blood pressure daily and send [place] a list in about two weeks of your readings.

## 2022-03-08 DIAGNOSIS — I10 Essential (primary) hypertension: Secondary | ICD-10-CM | POA: Insufficient documentation

## 2022-03-19 NOTE — Progress Notes (Deleted)
Cardiology Office Note:    Date:  03/19/2022   ID:  Frank Watts, DOB [birth year], MRN [id number]  PCP:  Pc, [facility]  Cardiologist:  Shirlee More, MD    Referring MD: Pc, [facility]*    ASSESSMENT:    No diagnosis found. PLAN:    In order of problems listed above:  ***   Next appointment: ***   Medication Adjustments/Labs and Tests Ordered: Current medicines are reviewed at length with the patient today.  Concerns regarding medicines are outlined above.  No orders of the defined types were placed in this encounter.  No orders of the defined types were placed in this encounter.   No chief complaint on file.   History of Present Illness:    Frank Watts is a 41 y.o. male with a hx of hypertensive heart disease with chronic diastolic heart failure morbid obesity with a BMI exceeding 70 and right bundle branch block.  Was last seen 12/15/2020. Compliance with diet, lifestyle and medications: *** Past Medical History:  Diagnosis Date   Acute CHF (congestive heart failure) ([place]) 67/20/9470   Acute diastolic CHF (congestive heart failure) ([place]) 09/22/2018   Acute hypoxemic respiratory failure ([place]) 09/19/2018   Essential hypertension 09/19/2018   HTN (hypertension)    Influenza 09/19/2018   Morbid obesity 09/19/2018   OSA (obstructive sleep apnea) 09/25/2018   Polycythemia 09/25/2018    Past Surgical History:  Procedure Laterality Date   OTHER SURGICAL HISTORY     Bicep surgery    Current Medications: No outpatient medications have been marked as taking for the 03/20/22 encounter (Appointment) with Richardo Priest, MD.     Allergies:   Lactose intolerance (gi)   Social History   Socioeconomic History   Marital status: Single    Spouse name: Not on file   Number of children: Not on file   Years of education: Not on file   Highest education level: Not on file  Occupational History   Not on file  Tobacco Use    Smoking status: Never   Smokeless tobacco: Never  Vaping Use   Vaping Use: Never used  Substance and Sexual Activity   Alcohol use: Not on file    Comment: Occasional   Drug use: Never   Sexual activity: Not on file  Other Topics Concern   Not on file  Social History Narrative   Not on file   Social Determinants of Health   Financial Resource Strain: Not on file  Food Insecurity: Not on file  Transportation Needs: Not on file  Physical Activity: Not on file  Stress: Not on file  Social Connections: Not on file     Family History: The patient's ***family history includes Hypertension in his brother and mother; Prostate cancer in his father. ROS:   Please see the history of present illness.    All other systems reviewed and are negative.  EKGs/Labs/Other Studies Reviewed:    The following studies were reviewed today:  EKG:  EKG ordered today and personally reviewed.  The ekg ordered today demonstrates ***  Recent Labs: No results found for requested labs within last 365 days.  Recent Lipid Panel    Component Value Date/Time   CHOL 144 09/22/2018 0758   TRIG 76 09/22/2018 0758   HDL 34 (L) 09/22/2018 0758   CHOLHDL 4.2 09/22/2018 0758   VLDL 15 09/22/2018 0758   LDLCALC 95 09/22/2018 0758    Physical Exam:  VS:  There were no vitals taken for this visit.    Wt Readings from Last 3 Encounters:  12/15/20 (!) 491 lb (222.7 kg)  12/17/18 (!) 486 lb (220.4 kg)  09/27/18 (!) 488 lb 15.7 oz (221.8 kg)     GEN: *** Well nourished, well developed in no acute distress HEENT: Normal NECK: No JVD; No carotid bruits LYMPHATICS: No lymphadenopathy CARDIAC: ***RRR, no murmurs, rubs, gallops RESPIRATORY:  Clear to auscultation without rales, wheezing or rhonchi  ABDOMEN: Soft, non-tender, non-distended MUSCULOSKELETAL:  No edema; No deformity  SKIN: Warm and dry NEUROLOGIC:  Alert and oriented x 3 PSYCHIATRIC:  Normal affect    Signed, Shirlee More, MD   03/19/2022 6:27 PM    [place]

## 2022-03-20 ENCOUNTER — Encounter: Payer: Self-pay | Admitting: Cardiology

## 2022-03-20 ENCOUNTER — Ambulatory Visit: Payer: PRIVATE HEALTH INSURANCE | Attending: Cardiology | Admitting: Cardiology

## 2023-04-15 ENCOUNTER — Encounter (HOSPITAL_COMMUNITY): Payer: Self-pay

## 2023-04-15 DIAGNOSIS — J9621 Acute and chronic respiratory failure with hypoxia: Secondary | ICD-10-CM

## 2023-04-15 DIAGNOSIS — I119 Hypertensive heart disease without heart failure: Secondary | ICD-10-CM

## 2023-04-15 DIAGNOSIS — G4733 Obstructive sleep apnea (adult) (pediatric): Secondary | ICD-10-CM

## 2023-04-15 DIAGNOSIS — I5033 Acute on chronic diastolic (congestive) heart failure: Secondary | ICD-10-CM | POA: Diagnosis not present

## 2023-04-15 DIAGNOSIS — J189 Pneumonia, unspecified organism: Secondary | ICD-10-CM | POA: Diagnosis not present

## 2023-04-16 DIAGNOSIS — I119 Hypertensive heart disease without heart failure: Secondary | ICD-10-CM | POA: Diagnosis not present

## 2023-04-16 DIAGNOSIS — I5033 Acute on chronic diastolic (congestive) heart failure: Secondary | ICD-10-CM | POA: Diagnosis not present

## 2023-04-16 DIAGNOSIS — J9621 Acute and chronic respiratory failure with hypoxia: Secondary | ICD-10-CM | POA: Diagnosis not present

## 2023-04-16 DIAGNOSIS — J189 Pneumonia, unspecified organism: Secondary | ICD-10-CM | POA: Diagnosis not present

## 2023-04-17 DIAGNOSIS — J189 Pneumonia, unspecified organism: Secondary | ICD-10-CM | POA: Diagnosis not present

## 2023-04-17 DIAGNOSIS — I5033 Acute on chronic diastolic (congestive) heart failure: Secondary | ICD-10-CM | POA: Diagnosis not present

## 2023-04-17 DIAGNOSIS — J9621 Acute and chronic respiratory failure with hypoxia: Secondary | ICD-10-CM | POA: Diagnosis not present

## 2023-04-17 DIAGNOSIS — I119 Hypertensive heart disease without heart failure: Secondary | ICD-10-CM | POA: Diagnosis not present

## 2023-05-12 DEATH — deceased

## 2023-05-14 ENCOUNTER — Encounter: Payer: Self-pay | Admitting: Internal Medicine
